# Patient Record
Sex: Male | Born: 1980 | Race: Black or African American | Hispanic: No | Marital: Married | Smoking: Never smoker
Health system: Southern US, Community
[De-identification: ages and names within clinical notes are randomized; demographics above are authoritative.]

## PROBLEM LIST (undated history)

## (undated) ENCOUNTER — Emergency Department (HOSPITAL_COMMUNITY): Admission: EM | Payer: BC Managed Care – PPO

## (undated) DIAGNOSIS — K219 Gastro-esophageal reflux disease without esophagitis: Secondary | ICD-10-CM

## (undated) HISTORY — DX: Gastro-esophageal reflux disease without esophagitis: K21.9

## (undated) HISTORY — PX: WISDOM TOOTH EXTRACTION: SHX21

---

## 2005-04-29 ENCOUNTER — Ambulatory Visit: Payer: Self-pay | Admitting: General Practice

## 2007-02-10 ENCOUNTER — Emergency Department (HOSPITAL_COMMUNITY): Admission: EM | Admit: 2007-02-10 | Discharge: 2007-02-10 | Payer: Self-pay | Admitting: Emergency Medicine

## 2011-02-05 LAB — GC/CHLAMYDIA PROBE AMP, GENITAL
Chlamydia, DNA Probe: NEGATIVE
GC Probe Amp, Genital: NEGATIVE

## 2011-02-05 LAB — POCT URINALYSIS DIP (DEVICE)
Bilirubin Urine: NEGATIVE
Glucose, UA: NEGATIVE
Hgb urine dipstick: NEGATIVE
Nitrite: NEGATIVE
Operator id: 247071
Urobilinogen, UA: 1

## 2011-03-25 ENCOUNTER — Ambulatory Visit: Payer: Self-pay | Admitting: Family Medicine

## 2011-03-25 ENCOUNTER — Ambulatory Visit (INDEPENDENT_AMBULATORY_CARE_PROVIDER_SITE_OTHER): Payer: BC Managed Care – PPO | Admitting: Family Medicine

## 2011-03-25 ENCOUNTER — Encounter: Payer: Self-pay | Admitting: Family Medicine

## 2011-03-25 DIAGNOSIS — Z23 Encounter for immunization: Secondary | ICD-10-CM

## 2011-03-25 DIAGNOSIS — Z823 Family history of stroke: Secondary | ICD-10-CM

## 2011-03-25 DIAGNOSIS — K219 Gastro-esophageal reflux disease without esophagitis: Secondary | ICD-10-CM

## 2011-03-25 DIAGNOSIS — Z833 Family history of diabetes mellitus: Secondary | ICD-10-CM

## 2011-03-25 DIAGNOSIS — M25559 Pain in unspecified hip: Secondary | ICD-10-CM

## 2011-03-25 NOTE — Patient Instructions (Addendum)
I would get a flu shot each fall.   I'll await your records.  I would recheck you labs next fall at a physical.  I would use the new holster and see if this doesn't gradually get better.  Take care.

## 2011-03-26 ENCOUNTER — Encounter: Payer: Self-pay | Admitting: Family Medicine

## 2011-03-26 DIAGNOSIS — Z23 Encounter for immunization: Secondary | ICD-10-CM | POA: Insufficient documentation

## 2011-03-26 DIAGNOSIS — M25559 Pain in unspecified hip: Secondary | ICD-10-CM | POA: Insufficient documentation

## 2011-03-26 DIAGNOSIS — K219 Gastro-esophageal reflux disease without esophagitis: Secondary | ICD-10-CM | POA: Insufficient documentation

## 2011-03-26 NOTE — Assessment & Plan Note (Signed)
Limit late night intake, elevated head of bed and try to limit PPI use.

## 2011-03-26 NOTE — Progress Notes (Signed)
Gerd- nocturnal sx, occ takes nexium.  Not on med currently.  Head of bed not elevated.  Eating late at night is a trigger.   R hip pain.  "nagging, doesn't feel right".  Occ feels stiff.  No known injury.  Some better after changing holsters, now sits higher on the belt.   Flu shot declined.   Meds, vitals, and allergies reviewed.   ROS: See HPI.  Otherwise, noncontributory.  GEN: nad, alert and oriented HEENT: mucous membranes moist NECK: supple w/o LA CV: rrr.  no murmur PULM: ctab, no inc wob ABD: soft, +bs EXT: no edema SKIN: no acute rash R hip with normal int/ext rotation, no pain on SI testing and no rash in the area.  No hernia.  Normal quad, abductor and adductor strength

## 2011-03-26 NOTE — Assessment & Plan Note (Signed)
Likely peripheral compression. No midline back pain.  Improved some with holster change. Should resolved.  No further w/u now.  Reassured.

## 2011-12-23 ENCOUNTER — Other Ambulatory Visit (INDEPENDENT_AMBULATORY_CARE_PROVIDER_SITE_OTHER): Payer: BC Managed Care – PPO

## 2011-12-23 DIAGNOSIS — Z823 Family history of stroke: Secondary | ICD-10-CM

## 2011-12-23 DIAGNOSIS — Z833 Family history of diabetes mellitus: Secondary | ICD-10-CM

## 2011-12-23 LAB — LIPID PANEL: Cholesterol: 160 mg/dL (ref 0–200)

## 2011-12-23 LAB — GLUCOSE, RANDOM: Glucose, Bld: 86 mg/dL (ref 70–99)

## 2011-12-30 ENCOUNTER — Ambulatory Visit (INDEPENDENT_AMBULATORY_CARE_PROVIDER_SITE_OTHER): Payer: BC Managed Care – PPO | Admitting: Family Medicine

## 2011-12-30 ENCOUNTER — Encounter: Payer: Self-pay | Admitting: Family Medicine

## 2011-12-30 VITALS — BP 118/74 | HR 72 | Temp 98.6°F | Ht 68.5 in | Wt 207.0 lb

## 2011-12-30 DIAGNOSIS — K219 Gastro-esophageal reflux disease without esophagitis: Secondary | ICD-10-CM

## 2011-12-30 DIAGNOSIS — M25579 Pain in unspecified ankle and joints of unspecified foot: Secondary | ICD-10-CM

## 2011-12-30 DIAGNOSIS — R109 Unspecified abdominal pain: Secondary | ICD-10-CM

## 2011-12-30 DIAGNOSIS — Z0000 Encounter for general adult medical examination without abnormal findings: Secondary | ICD-10-CM

## 2011-12-30 NOTE — Progress Notes (Signed)
CPE- See plan.  Routine anticipatory guidance given to patient.  See health maintenance. Tetanus 2011  Flu shot.  D/w pt.  Encouraged.   Colon and prostate cancer screening not indicated.  Living will.  D/w pt.  He's going to consider this.  He doesn't have one yet.   Exercising frequently.    GERD controlled with rare sx, rare PPI use.  Doesn't need a refill today.   R ankle popping.  Going on for years.  No pain.  No ROM deficit.    Occ lower abd pain followed by gas or BM. Pain relieved at that point.  No blood in stool.  No vomiting.  Irregular pattern.  Rare frequency, ~ once every other month.    PMH and SH reviewed  Meds, vitals, and allergies reviewed.   ROS: See HPI.  Otherwise negative.    GEN: nad, alert and oriented HEENT: mucous membranes moist NECK: supple w/o LA CV: rrr. PULM: ctab, no inc wob ABD: soft, +bs EXT: no edema SKIN: no acute rash R ankle with click on dorsiflexion occ but no laxity or edema/erythema.  No bruising.  Able to weight bear.  Not focally tender (but he does have f/u arch pain with exercise per his report).  [place] intact.

## 2011-12-30 NOTE — Patient Instructions (Addendum)
I would get a flu shot each fall.   Get a soft arch support insert and use a soft ankle sleeve.  That should help.  Take care.  Check up in about 2 years.

## 2011-12-31 DIAGNOSIS — M25579 Pain in unspecified ankle and joints of unspecified foot: Secondary | ICD-10-CM | POA: Insufficient documentation

## 2011-12-31 DIAGNOSIS — Z0000 Encounter for general adult medical examination without abnormal findings: Secondary | ICD-10-CM | POA: Insufficient documentation

## 2011-12-31 DIAGNOSIS — R109 Unspecified abdominal pain: Secondary | ICD-10-CM | POA: Insufficient documentation

## 2011-12-31 NOTE — Assessment & Plan Note (Signed)
Routine anticipatory guidance given to patient.  See health maintenance. Tetanus 2011  Flu shot.  D/w pt.  Encouraged.   Colon and prostate cancer screening not indicated.  Living will.  D/w pt.  He's going to consider this.  He doesn't have one yet.   Exercising frequently.

## 2011-12-31 NOTE — Assessment & Plan Note (Signed)
-   Continue prn PPI

## 2011-12-31 NOTE — Assessment & Plan Note (Signed)
Likely functional, ie gas related.  Rare sx with no red flags and normal exam.  Would follow up prn.  He agrees.

## 2011-12-31 NOTE — Assessment & Plan Note (Signed)
Would use soft arch supports and and ankle sleeve prn.  Benign exam o/w and he'll f/u prn.  I don't appreciate any laxity, weakness or tenderness over bony prominences.

## 2012-04-17 ENCOUNTER — Encounter (HOSPITAL_COMMUNITY): Payer: Self-pay | Admitting: Emergency Medicine

## 2012-04-17 ENCOUNTER — Emergency Department (HOSPITAL_COMMUNITY)
Admission: EM | Admit: 2012-04-17 | Discharge: 2012-04-17 | Disposition: A | Discharge status: Home / Self Care | Payer: BC Managed Care – PPO | Source: Home / Self Care

## 2012-04-17 DIAGNOSIS — J069 Acute upper respiratory infection, unspecified: Secondary | ICD-10-CM

## 2012-04-17 MED ORDER — GUAIFENESIN-CODEINE 100-10 MG/5ML PO SYRP: 5.0000 mL | ORAL_SOLUTION | Freq: Three times a day (TID) | ORAL | Status: DC | PRN

## 2012-04-17 NOTE — ED Notes (Signed)
Pt c/o poss flu... Pt's wife recently dx w/flu... Sx today include: dry cough, chest discomfort due to cough, headache, runny nose... Denies: fevers, vomiting, nauseas, diarrhea... He is alert w/no signs of acute distress.

## 2012-04-17 NOTE — ED Provider Notes (Signed)
Tanner Diaz is a 31 y.o. male who presents to Urgent Care today for cough headache sinus pressure and congestion present for one day.  His most bothersome symptom is a cough.  He notes positive sick contacts with respiratory viruses at home. He denies any fevers chills trouble breathing body aches.  He feels well otherwise. He left work early yesterday.  He tried some Robitussin which helped a bit.     PMH reviewed. Significant for GERD History  Substance Use Topics  . Smoking status: Never Smoker   . Smokeless tobacco: Never Used  . Alcohol Use: No   ROS as above Medications reviewed. No current facility-administered medications for this encounter.   Current Outpatient Prescriptions  Medication Sig Dispense Refill  . esomeprazole (NEXIUM) 40 MG capsule Take 40 mg by mouth daily as needed. Taken prn for GERD      . guaiFENesin-codeine (ROBITUSSIN AC) 100-10 MG/5ML syrup Take 5 mLs by mouth 3 (three) times daily as needed for cough.  120 mL  0  . Nutritional Supplements (COMPLETE PROTEIN/VITAMIN SHAKE PO) Take by mouth daily.        Exam:  BP 123/79  Pulse 106  Temp 99.7 F (37.6 C) (Oral)  Resp 18  SpO2 98% Gen: Well NAD HEENT: EOMI,  MMM, normal-appearing posterior pharynx and tympanic membranes. No significant cervical lymphadenopathy Lungs: CTABL Nl WOB Heart: RRR no MRG Abd: NABS, NT, ND Exts: Non edematous BL  LE, warm and well perfused.   No results found for this or any previous visit (from the past 24 hour(s)). No results found.  Assessment and Plan: 31 y.o. male with viral URI with cough. Discussed options. Plan to treat symptomatically with Tylenol and ibuprofen as needed. Additionally use codeine containing cough medication as needed for bothersome cough. Discussed warning signs or symptoms. Please see discharge instructions. Patient expresses understanding. Followup as needed     Rodolph Bong, MD 04/17/12 [phone]

## 2012-04-26 NOTE — ED Provider Notes (Signed)
Medical screening examination/treatment/procedure(s) were performed by resident physician or non-physician practitioner and as supervising physician I was immediately available for consultation/collaboration.   Barkley Bruns MD.    Linna Hoff, MD 04/26/12 2024

## 2012-09-14 ENCOUNTER — Encounter: Payer: Self-pay | Admitting: Family Medicine

## 2012-09-14 ENCOUNTER — Ambulatory Visit (INDEPENDENT_AMBULATORY_CARE_PROVIDER_SITE_OTHER): Payer: BC Managed Care – PPO | Admitting: Family Medicine

## 2012-09-14 ENCOUNTER — Encounter: Payer: Self-pay | Admitting: *Deleted

## 2012-09-14 VITALS — BP 104/76 | HR 73 | Temp 98.4°F | Wt 228.5 lb

## 2012-09-14 DIAGNOSIS — R1031 Right lower quadrant pain: Secondary | ICD-10-CM | POA: Insufficient documentation

## 2012-09-14 NOTE — Patient Instructions (Addendum)
If the pain increases or you notice more of a bulge, then let us know and we can set you up with the general surgeons.  Should you have a dramatic increase in pain (and I don't expect this to happen), then go to the ER.  Take care.  Glad to see you.
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
## 2012-09-14 NOTE — Assessment & Plan Note (Signed)
Benign hip exam.  He could have had a nagging bursitis from the fall earlier.  He appears to have a small inguinal hernia.  Anatomy discussed.  We talked about options.  Offered referral to surgery.  Another option is monitoring at home with routine cautions.  If not improved or if increasing (with strangulation/incarceration cautions given), then he'll notify us and we can refer. He opts for the latter.  This is reasonable.

## 2012-11-08 ENCOUNTER — Ambulatory Visit: Payer: BC Managed Care – PPO | Admitting: Family Medicine

## 2012-11-08 DIAGNOSIS — Z0289 Encounter for other administrative examinations: Secondary | ICD-10-CM

## 2012-11-10 ENCOUNTER — Encounter: Payer: Self-pay | Admitting: Family Medicine

## 2012-11-10 ENCOUNTER — Ambulatory Visit (INDEPENDENT_AMBULATORY_CARE_PROVIDER_SITE_OTHER): Payer: BC Managed Care – PPO | Admitting: Family Medicine

## 2012-11-10 VITALS — BP 118/76 | HR 80 | Temp 98.5°F | Wt 225.5 lb

## 2012-11-10 DIAGNOSIS — R1031 Right lower quadrant pain: Secondary | ICD-10-CM

## 2012-11-10 NOTE — Progress Notes (Signed)
F/u for the prev groin complaint.  The sx resolved about 1 week after the last OV.  He needed recheck for work.  No R groin pain now unless at extremes of abduction.  No functional impairment.   Meds, vitals, and allergies reviewed.   ROS: See HPI.  Otherwise, noncontributory.  nad Testes bilaterally descended without nodularity, tenderness or masses. No scrotal masses or lesions.  I don't feel any asymmetry with hernia exam today- no focal bulge or mass.

## 2012-11-10 NOTE — Patient Instructions (Addendum)
Go back to normal activity and let me know if you have troubles or a bulge.

## 2012-11-11 ENCOUNTER — Ambulatory Visit: Payer: BC Managed Care – PPO | Admitting: Family Medicine

## 2012-11-11 NOTE — Assessment & Plan Note (Signed)
Resolved, no mass noted, could have been a groin strain.  If he has other sx, he'll notify.  Return to baseline work status in meantime. F/u prn.  He agrees. D/w pt.

## 2013-03-22 ENCOUNTER — Telehealth: Payer: Self-pay

## 2013-03-22 DIAGNOSIS — R1030 Lower abdominal pain, unspecified: Secondary | ICD-10-CM

## 2013-03-22 NOTE — Telephone Encounter (Signed)
Pt left v/m; pt was seen 11/10/2012 and request referral. Left v.m for pt to cb to find out what problem pt is having and what type referral pt needs.

## 2013-03-22 NOTE — Telephone Encounter (Signed)
Referral placed  Thanks!

## 2013-03-22 NOTE — Telephone Encounter (Signed)
Pt said pain or discomfort has returned to rt groin area in the last week; no bulging noted. Pain level now is 2.pt request referral to doctor in [place]. Pt will cb if condition changes or worsens prior to cb.

## 2013-04-07 ENCOUNTER — Ambulatory Visit
Admission: RE | Admit: 2013-04-07 | Discharge: 2013-04-07 | Disposition: A | Discharge status: Home / Self Care | Payer: BC Managed Care – PPO | Source: Ambulatory Visit | Attending: General Surgery | Admitting: General Surgery

## 2013-04-07 ENCOUNTER — Encounter (INDEPENDENT_AMBULATORY_CARE_PROVIDER_SITE_OTHER): Payer: Self-pay | Admitting: General Surgery

## 2013-04-07 ENCOUNTER — Encounter (INDEPENDENT_AMBULATORY_CARE_PROVIDER_SITE_OTHER): Payer: Self-pay

## 2013-04-07 ENCOUNTER — Telehealth (INDEPENDENT_AMBULATORY_CARE_PROVIDER_SITE_OTHER): Payer: Self-pay | Admitting: General Surgery

## 2013-04-07 ENCOUNTER — Ambulatory Visit (INDEPENDENT_AMBULATORY_CARE_PROVIDER_SITE_OTHER): Payer: BC Managed Care – PPO | Admitting: General Surgery

## 2013-04-07 VITALS — BP 130/78 | HR 84 | Temp 97.0°F | Resp 18 | Ht 70.0 in | Wt 231.0 lb

## 2013-04-07 DIAGNOSIS — R1030 Lower abdominal pain, unspecified: Secondary | ICD-10-CM

## 2013-04-07 DIAGNOSIS — R1031 Right lower quadrant pain: Secondary | ICD-10-CM

## 2013-04-07 MED ORDER — LORAZEPAM 1 MG PO TABS: 1.0000 mg | ORAL_TABLET | Freq: Once | ORAL | Status: DC

## 2013-04-07 NOTE — Patient Instructions (Signed)
Get your xray done We will call you with results If negative, we will schedule an MRI

## 2013-04-07 NOTE — Telephone Encounter (Signed)
Informed pt that hip xray negative. He wants to proceed with MRI to evaluate for "sport's hernia". Told him our office would start working on it tomorrow. Will also send in Rx for ativan - on call for scan

## 2013-04-07 NOTE — Progress Notes (Signed)
Patient ID: Tanner Diaz, male   DOB: April 17, 1981, 33 y.o.   MRN: [id number]  Chief Complaint  Patient presents with  . Inguinal Hernia    right    HPI Tanner Diaz is a 32 y.o. male.   HPI 32 year old [place] American male referred by Dr. Para March for evaluation of right inguinal pain. The patient states about a year ago he developed right hip pain. This was mainly on the lateral aspect of his hip where he wore his sidearm. He changed how he carried his SideArm and his lateral right hip pain resolved. Back in the early spring he developed right groin pain. He states that it's truly not pain per se. He states that it just didn't feel right.He describes it as a pulling sensation.Tanner Diaz He states that he felt like he pulled his groin muscle. He states that by stretching his right leg and externally rotating his leg it'll help decrease that sensation. He denies any right large hernia weakness or numbness or tingling. He denies any sensation of burning or stinging in his groin. He denies noticing a bulge. He denies any nausea, vomiting, diarrhea or constipation. He states that he will notice a pulling sensation if he sits for prolonged period of time. He did a period of rest without any weight lifting or physical exertional activity and is improved after 8 weeks of rest.  However when he started resuming full extremities the sensation returned just like it was before. Past Medical History  Diagnosis Date  . GERD (gastroesophageal reflux disease)     History reviewed. No pertinent past surgical history.  Family History  Problem Relation Age of Onset  . Diabetes Other   . Stroke Father   . Diabetes Father   . Colon cancer Neg Hx   . Prostate cancer Neg Hx     Social History History  Substance Use Topics  . Smoking status: Never Smoker   . Smokeless tobacco: Never Used  . Alcohol Use: No    No Known Allergies  Current Outpatient Prescriptions  Medication Sig Dispense Refill  . LORazepam  (ATIVAN) 1 MG tablet Take 1 tablet (1 mg total) by mouth once. Take at MRI office prior to scan  1 tablet  0   No current facility-administered medications for this visit.    Review of Systems Review of Systems  Constitutional: Negative for fever, chills, appetite change and unexpected weight change.  HENT: Negative for congestion and trouble swallowing.   Eyes: Negative for visual disturbance.  Respiratory: Negative for chest tightness and shortness of breath.   Cardiovascular: Negative for chest pain and leg swelling.       No PND, no orthopnea, no DOE  Gastrointestinal:       See HPI  Genitourinary: Negative for dysuria, hematuria, penile swelling, scrotal swelling, difficulty urinating and testicular pain.  Musculoskeletal: Negative.   Skin: Negative for rash.  Neurological: Negative for seizures and speech difficulty.  Hematological: Does not bruise/bleed easily.  Psychiatric/Behavioral: Negative for behavioral problems and confusion.    Blood pressure 130/78, pulse 84, temperature 97 F (36.1 C), temperature source Temporal, resp. rate 18, height 5\' 10"  (1.778 m), weight 231 lb (104.781 kg).  Physical Exam Physical Exam  Constitutional: He is oriented to person, place, and time. He appears well-developed and well-nourished. No distress.  HENT:  Head: Normocephalic and atraumatic.  Right Ear: External ear normal.  Left Ear: External ear normal.  Eyes: Conjunctivae are normal. No scleral icterus.  Neck: Normal range of  motion. Neck supple. No tracheal deviation present. No thyromegaly present.  Cardiovascular: Normal rate, normal heart sounds and intact distal pulses.   Pulmonary/Chest: Effort normal and breath sounds normal. No respiratory distress. He has no wheezes.  Abdominal: Soft. He exhibits no distension. There is no tenderness. There is no guarding. Hernia confirmed negative in the right inguinal area and confirmed negative in the left inguinal area.  Genitourinary:  Testes normal and penis normal.  Patient examined supine and standing with and without Valsalva maneuvers. No obvious bulge. No obvious inguinal hernia on either side with exam. No appreciable increase in size of right inguinal ring compared to left  Musculoskeletal: Normal range of motion. He exhibits no edema and no tenderness.  Lymphadenopathy:    He has no cervical adenopathy.  Neurological: He is alert and oriented to person, place, and time. He exhibits normal muscle tone.  Skin: Skin is warm and dry. No rash noted. He is not diaphoretic. No erythema. No pallor.  Psychiatric: He has a normal mood and affect. His behavior is normal. Judgment and thought content normal.    Data Reviewed Dr Lianne Bushy office notes 5/20, 7/16  Assessment    Right inguinal pain     Plan    I really do not appreciate any inguinal hernia on exam. Since this is a different sensation than his right lateral hip discomfort my suspicion for labral tear is low. This is sounding more like a "sports hernia". We discussed this medical diagnosis. We discussed the typical first step of management is to rest and avoiding heavy lifting, pushing and pulling. He essentially has done this And when he resumed activity the discomfort returned. We discussed that this is a truly vexing condition and that there is no right way of how to work it up and/or manage it. I discussed that the best test to find any type of abdominal wall abnormality would be an MRI of his pelvis. We also discussed diagnostic laparoscopy but my first recommendation would be to start with more detailed imaging such as MRI of his pelvis to look for changes in his abdominal wall anatomy suggestive of a "sports hernia". He first wanted to start out with plain x-rays. I told her my suspicion for bony injury is quite quite low however we would not lose anything by getting a set of plain films. We agreed we would start with a set of plain films and if they were  negative to proceed with the MRI. He expressed some concern about the length of time that the MRI takes as well as being in a closed space. I told him I could give him a small dose of Ativan to take just prior to the MRI. Our followup will be based on the results of the imaging.    Mary Sella. Andrey Campanile, MD, FACS General, Bariatric, & Minimally Invasive Surgery [facility], [place]        [facility] M 04/07/2013, 6:07 PM

## 2013-04-08 NOTE — Addendum Note (Signed)
Addended byLiliana Cline on: 04/08/2013 08:51 AM   Modules accepted: Orders

## 2013-04-08 NOTE — Telephone Encounter (Signed)
MR order entered and given to referral coordinator to set up. Ativan called to patient's pharmacy.

## 2013-04-11 ENCOUNTER — Telehealth (INDEPENDENT_AMBULATORY_CARE_PROVIDER_SITE_OTHER): Payer: Self-pay | Admitting: *Deleted

## 2013-04-11 NOTE — Telephone Encounter (Signed)
LMOM for pt to return my call.  I was calling to notify him of an appt for his MRI at [facility] on 12/20 with an arrival time of 7:15am.

## 2013-04-12 NOTE — Telephone Encounter (Signed)
Pt returned my call and was given appt info below.

## 2013-04-16 ENCOUNTER — Inpatient Hospital Stay
Admission: RE | Admit: 2013-04-16 | Discharge: 2013-04-16 | Disposition: A | Discharge status: Home / Self Care | Payer: BC Managed Care – PPO | Source: Ambulatory Visit | Attending: General Surgery | Admitting: General Surgery

## 2013-04-25 ENCOUNTER — Telehealth (INDEPENDENT_AMBULATORY_CARE_PROVIDER_SITE_OTHER): Payer: Self-pay | Admitting: General Surgery

## 2013-04-25 NOTE — Telephone Encounter (Signed)
Left message for patient to call me regarding followup plan. His MRI of his pelvis was declined by insurance. If the patient is still having right inguinal pain I like to see him back in the office for followup.

## 2013-06-17 ENCOUNTER — Other Ambulatory Visit: Payer: Self-pay | Admitting: Family Medicine

## 2013-06-17 MED ORDER — OSELTAMIVIR PHOSPHATE 75 MG PO CAPS: 75.0000 mg | ORAL_CAPSULE | Freq: Two times a day (BID) | ORAL | Status: DC

## 2013-06-18 NOTE — Progress Notes (Signed)
Family member dx'd with flu.  Rx for appropriate dose of tamiflu given to patient to use if sx develop.  Routine instructions and cautions given.  

## 2013-12-05 ENCOUNTER — Encounter: Payer: Self-pay | Admitting: Family Medicine

## 2013-12-05 ENCOUNTER — Encounter (INDEPENDENT_AMBULATORY_CARE_PROVIDER_SITE_OTHER): Payer: Self-pay

## 2013-12-05 ENCOUNTER — Ambulatory Visit (INDEPENDENT_AMBULATORY_CARE_PROVIDER_SITE_OTHER): Payer: BC Managed Care – PPO | Admitting: Family Medicine

## 2013-12-05 VITALS — BP 110/76 | HR 73 | Temp 98.0°F | Wt 230.2 lb

## 2013-12-05 DIAGNOSIS — M25571 Pain in right ankle and joints of right foot: Secondary | ICD-10-CM

## 2013-12-05 DIAGNOSIS — M25579 Pain in unspecified ankle and joints of unspecified foot: Secondary | ICD-10-CM

## 2013-12-05 NOTE — Progress Notes (Signed)
Pre visit review using our clinic review tool, if applicable. No additional management support is needed unless otherwise documented below in the visit note.  R foot and ankle pain.  More in the last week.  Pain with walking.  Pain prior to getting out of bed.  It was initially getting better by the middle of the day.  It doesn't swell.  No bruising.  No trauma.  Usually in boots at work, not steel toes.  Feels no better in boots vs flip flops.  Can't jog on a treadmill due to pain.  No L foot pain.  Some popping with ROM at the R ankle.    Meds, vitals, and allergies reviewed.   ROS: See HPI.  Otherwise, noncontributory.  nad R leg and foot with normal inspection initially but loss of longitudinal and transverse arches with weight bearing No bruising, no swelling, normal DP pulse Achilles not ttp but ttp posterior to med and lat mal.  Plantar fascia ttp.  Distally nv intact Not ttp over bony prominences.

## 2013-12-05 NOTE — Patient Instructions (Signed)
Use the ankle support and avoid flip flops for now.  Get full length soft arch support inserts.  Stretch the bottom of your foot before you get out of bed.  Ice your foot as needed.  Ibuprofen as needed for pain with food in the meantime.  Notify me if not better.  Take care. Glad to see you.

## 2013-12-06 NOTE — Assessment & Plan Note (Addendum)
Likely tendonitis, posterior tib and posterior fib along with plantar fascia pain.  Felt better in ASO, more solid on standing.  Use ASO when weight bearing and stretch plantar fascia before standing.  Ice and nsaids reasonable.  GI caution.  Soft arch support inserts, may need additional arch support added on, would only change 1 think at a time in the sole of his shoes.   With ASO and relative dec in ROM, then the tendonitis should improve.  He agrees.  Achilles stable and not ttp.  No running for now. Gradually wean out of brace.  Call back as needed.

## 2014-05-25 ENCOUNTER — Encounter: Payer: Self-pay | Admitting: Internal Medicine

## 2014-05-25 ENCOUNTER — Ambulatory Visit (INDEPENDENT_AMBULATORY_CARE_PROVIDER_SITE_OTHER): Payer: BC Managed Care – PPO | Admitting: Internal Medicine

## 2014-05-25 VITALS — BP 118/84 | HR 88 | Temp 98.4°F | Wt 234.5 lb

## 2014-05-25 DIAGNOSIS — J069 Acute upper respiratory infection, unspecified: Secondary | ICD-10-CM

## 2014-05-25 DIAGNOSIS — R6883 Chills (without fever): Secondary | ICD-10-CM

## 2014-05-25 NOTE — Progress Notes (Signed)
Pre visit review using our clinic review tool, if applicable. No additional management support is needed unless otherwise documented below in the visit note. 

## 2014-05-25 NOTE — Patient Instructions (Signed)
Upper Respiratory Infection, Adult An upper respiratory infection (URI) is also sometimes known as the common cold. The upper respiratory tract includes the nose, sinuses, throat, trachea, and bronchi. Bronchi are the airways leading to the lungs. Most people improve within 1 week, but symptoms can last up to 2 weeks. A residual cough may last even longer.  CAUSES Many different viruses can infect the tissues lining the upper respiratory tract. The tissues become irritated and inflamed and often become very moist. Mucus production is also common. A cold is contagious. You can easily spread the virus to others by oral contact. This includes kissing, sharing a glass, coughing, or sneezing. Touching your mouth or nose and then touching a surface, which is then touched by another person, can also spread the virus. SYMPTOMS  Symptoms typically develop 1 to 3 days after you come in contact with a cold virus. Symptoms vary from person to person. They may include:  Runny nose.  Sneezing.  Nasal congestion.  Sinus irritation.  Sore throat.  Loss of voice (laryngitis).  Cough.  Fatigue.  Muscle aches.  Loss of appetite.  Headache.  Low-grade fever. DIAGNOSIS  You might diagnose your own cold based on familiar symptoms, since most people get a cold 2 to 3 times a year. Your caregiver can confirm this based on your exam. Most importantly, your caregiver can check that your symptoms are not due to another disease such as strep throat, sinusitis, pneumonia, asthma, or epiglottitis. Blood tests, throat tests, and X-rays are not necessary to diagnose a common cold, but they may sometimes be helpful in excluding other more serious diseases. Your caregiver will decide if any further tests are required. RISKS AND COMPLICATIONS  You may be at risk for a more severe case of the common cold if you smoke cigarettes, have chronic heart disease (such as heart failure) or lung disease (such as asthma), or if  you have a weakened immune system. The very young and very old are also at risk for more serious infections. Bacterial sinusitis, middle ear infections, and bacterial pneumonia can complicate the common cold. The common cold can worsen asthma and chronic obstructive pulmonary disease (COPD). Sometimes, these complications can require emergency medical care and may be life-threatening. PREVENTION  The best way to protect against getting a cold is to practice good hygiene. Avoid oral or hand contact with people with cold symptoms. Wash your hands often if contact occurs. There is no clear evidence that vitamin C, vitamin E, echinacea, or exercise reduces the chance of developing a cold. However, it is always recommended to get plenty of rest and practice good nutrition. TREATMENT  Treatment is directed at relieving symptoms. There is no cure. Antibiotics are not effective, because the infection is caused by a virus, not by bacteria. Treatment may include:  Increased fluid intake. Sports drinks offer valuable electrolytes, sugars, and fluids.  Breathing heated mist or steam (vaporizer or shower).  Eating chicken soup or other clear broths, and maintaining good nutrition.  Getting plenty of rest.  Using gargles or lozenges for comfort.  Controlling fevers with ibuprofen or acetaminophen as directed by your caregiver.  Increasing usage of your inhaler if you have asthma. Zinc gel and zinc lozenges, taken in the first 24 hours of the common cold, can shorten the duration and lessen the severity of symptoms. Pain medicines may help with fever, muscle aches, and throat pain. A variety of non-prescription medicines are available to treat congestion and runny nose. Your caregiver   can make recommendations and may suggest nasal or lung inhalers for other symptoms.  HOME CARE INSTRUCTIONS   Only take over-the-counter or prescription medicines for pain, discomfort, or fever as directed by your  caregiver.  Use a warm mist humidifier or inhale steam from a shower to increase air moisture. This may keep secretions moist and make it easier to breathe.  Drink enough water and fluids to keep your urine clear or pale yellow.  Rest as needed.  Return to work when your temperature has returned to normal or as your caregiver advises. You may need to stay home longer to avoid infecting others. You can also use a face mask and careful hand washing to prevent spread of the virus. SEEK MEDICAL CARE IF:   After the first few days, you feel you are getting worse rather than better.  You need your caregiver's advice about medicines to control symptoms.  You develop chills, worsening shortness of breath, or brown or red sputum. These may be signs of pneumonia.  You develop yellow or brown nasal discharge or pain in the face, especially when you bend forward. These may be signs of sinusitis.  You develop a fever, swollen neck glands, pain with swallowing, or white areas in the back of your throat. These may be signs of strep throat. SEEK IMMEDIATE MEDICAL CARE IF:   You have a fever.  You develop severe or persistent headache, ear pain, sinus pain, or chest pain.  You develop wheezing, a prolonged cough, cough up blood, or have a change in your usual mucus (if you have chronic lung disease).  You develop sore muscles or a stiff neck. Document Released: 10/08/2000 Document Revised: 07/07/2011 Document Reviewed: 07/20/2013 Exit[employer] Patient Information 2015 [employer]. This information is not intended to replace advice given to you by your health care provider. Make sure you discuss any questions you have with your health care provider.  

## 2014-05-25 NOTE — Progress Notes (Signed)
HPI  [name] is a 34 y.o. presenting to clinic due to chills, headache and nasal congestion x 2 days. He woke up yesterday morning and "felt like trash." Had sore throat, chills and overall body aches so stayed in bed all day. He wasn't able to sleep well last night because he can't breathe through his nose. 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 Feels better today without throat soreness but still can't breathe through nose. No cough, PND or fever. Daughter has a runny nose. Interested in being tested for the flu due to chills/body aches. He does not take a flu shot.  Review of Systems  Past Medical History  Diagnosis Date  . GERD (gastroesophageal reflux disease)     Family History  Problem Relation Age of Onset  . Diabetes Other   . Stroke Father   . Diabetes Father   . Colon cancer Neg Hx   . Prostate cancer Neg Hx     History   Social History  . Marital Status: Married    Spouse Name: N/A    Number of Children: N/A  . Years of Education: N/A   Occupational History  . Emergency planning/management officerolice Officer    Social History Main Topics  . Smoking status: Never Smoker   . Smokeless tobacco: Never Used  . Alcohol Use: No  . Drug Use: No  . Sexual Activity: Not on file   Other Topics Concern  . Not on file   Social History Narrative   [employer]   [employer] fan   Married 2004   2 kids   Likes to hunt    No Known Allergies   Constitutional: Positive headache, body aches, and fatigue. Denies fever or abrupt weight changes.  HEENT:  Positive nasal congestion. Denies sore throat, eye redness, eye pain, pressure behind the eyes, facial pain, ear pain, ringing in the ears, wax buildup, runny nose or bloody nose. Respiratory: Denies cough, difficulty breathing or shortness of breath.  Cardiovascular: Denies chest pain, chest tightness, palpitations or swelling in the hands or feet.   No other specific complaints in a complete review of systems  (except as listed in HPI above).  Objective:   BP 118/84 mmHg  Pulse 88  Temp(Src) 98.4 F (36.9 C) (Oral)  Wt 234 lb 8 oz (106.369 kg)  SpO2 98% Wt Readings from Last 3 Encounters:  05/25/14 234 lb 8 oz (106.369 kg)  12/05/13 230 lb 4 oz (104.441 kg)  04/07/13 231 lb (104.781 kg)   General: Appears his stated age, well developed,  HEENT: Head: normal shape and size; Eyes: sclera white, no icterus, conjunctiva pink; Ears: Tm's gray and intact, normal light reflex; Nose: mucosa pink and moist, septum midline; Throat/Mouth: + PND. Teeth present, mucosa erythematous and moist, no exudate noted, no lesions or ulcerations noted.  Neck: No adenopathy noted.  Cardiovascular: Normal rate and rhythm. S1,S2 noted.  No murmur, rubs or gallops noted.  Pulmonary/Chest: Normal effort and positive vesicular breath sounds. No respiratory distress. No wheezes, rales or ronchi noted.      Assessment & Plan:   Viral Upper Respiratory Infection:  Rapid Flu: negative Get some rest and drink plenty of water. Continue OTC Alka Seltzer. Flonase - 2 sprays in each nostril for congestion Can use Sudafed for more sinus relief.  RTC as needed or if symptoms persist.

## 2014-05-25 NOTE — Progress Notes (Signed)
Started yesterday morn

## 2014-06-15 LAB — POCT INFLUENZA A/B
Influenza A, POC: NEGATIVE
Influenza B, POC: NEGATIVE

## 2014-06-15 NOTE — Addendum Note (Signed)
Addended by: Roena MaladyEVONTENNO, Keshawna Dix Y on: 06/15/2014 12:16 PM   Modules accepted: Orders

## 2014-09-14 ENCOUNTER — Ambulatory Visit (INDEPENDENT_AMBULATORY_CARE_PROVIDER_SITE_OTHER): Payer: BC Managed Care – PPO | Admitting: Family Medicine

## 2014-09-14 ENCOUNTER — Telehealth: Payer: Self-pay

## 2014-09-14 ENCOUNTER — Encounter: Payer: Self-pay | Admitting: Family Medicine

## 2014-09-14 VITALS — BP 106/80 | HR 70 | Temp 98.5°F | Ht 70.0 in | Wt 228.2 lb

## 2014-09-14 DIAGNOSIS — Z0000 Encounter for general adult medical examination without abnormal findings: Secondary | ICD-10-CM

## 2014-09-14 DIAGNOSIS — B360 Pityriasis versicolor: Secondary | ICD-10-CM

## 2014-09-14 DIAGNOSIS — L089 Local infection of the skin and subcutaneous tissue, unspecified: Secondary | ICD-10-CM | POA: Diagnosis not present

## 2014-09-14 DIAGNOSIS — Z7189 Other specified counseling: Secondary | ICD-10-CM

## 2014-09-14 DIAGNOSIS — M549 Dorsalgia, unspecified: Secondary | ICD-10-CM

## 2014-09-14 DIAGNOSIS — L918 Other hypertrophic disorders of the skin: Secondary | ICD-10-CM | POA: Diagnosis not present

## 2014-09-14 DIAGNOSIS — Z823 Family history of stroke: Secondary | ICD-10-CM

## 2014-09-14 LAB — LIPID PANEL
CHOLESTEROL: 183 mg/dL (ref 0–200)
HDL: 55.8 mg/dL (ref >39.00)
LDL CALC: 116 mg/dL — AB (ref 0–99)
NONHDL: 127.2
Total CHOL/HDL Ratio: 3
Triglycerides: 58 mg/dL (ref 0.0–149.0)
VLDL: 11.6 mg/dL (ref 0.0–40.0)

## 2014-09-14 LAB — GLUCOSE, RANDOM: Glucose, Bld: 81 mg/dL (ref 70–99)

## 2014-09-14 MED ORDER — SELENIUM SULFIDE 2.5 % EX LOTN: 1.0000 "application " | TOPICAL_LOTION | Freq: Every day | CUTANEOUS | Status: DC | PRN

## 2014-09-14 NOTE — Progress Notes (Signed)
Pre visit review using our clinic review tool, if applicable. No additional management support is needed unless otherwise documented below in the visit note.  CPE- See plan.  Routine anticipatory guidance given to patient.  See health maintenance. Tetanus 2011 Flu d/w pt.   PNA and shingles not due Prostate cancer screening and PSA options (with potential risks and benefits of testing vs not testing) were discussed along with recent recs/guidelines.  PSA not indicated.  Colon cancer screening not due.  Living will d/w pt.  Wife designated if patient were incapacitated.  Diet and exercise d/w pt.  Working on gradual weight loss.  He's working on more cardio.    L midback pain occ noted.  No trauma.  occ noted with long driving.  No rash in that specific area.  No bruising.  Not constant pain.  No sciatica sx.  No midline pain.    Rash on upper back noted. occ itchy.  Will change color with sun exposure.  Skin tag on R side of neck.  Gets irritated.  Wants removal.   PMH and SH reviewed  Meds, vitals, and allergies reviewed.   ROS: See HPI.  Otherwise negative.    GEN: nad, alert and oriented HEENT: mucous membranes moist NECK: supple w/o LA CV: rrr. PULM: ctab, no inc wob ABD: soft, +bs EXT: no edema SKIN: no acute rash but tinea versicolor noted on back.  Skin tag on R side of neck.  Back w/o midline pain.  L side of back not ttp, normal back rom, distally s/s wnl BLE.

## 2014-09-14 NOTE — Patient Instructions (Addendum)
We'll contact you with your lab report. Use the cream on your back and that should help. Use it for 1 week.   Gently stretch your back.  Keep the skin tag area covered.   Take care.  Glad to see you.

## 2014-09-14 NOTE — Telephone Encounter (Signed)
Tanner Diaz with [facility] left v/m; received rx for selsun 2.5 % shampoo; Tobi Bastosnna said they have selsun 2.25 % shampoo and 2.5 % lotion. Tanner Diaz request cb.

## 2014-09-14 NOTE — Telephone Encounter (Signed)
Please give the order for lotion.  Thanks.

## 2014-09-14 NOTE — Telephone Encounter (Signed)
Anna advised.

## 2014-09-15 ENCOUNTER — Encounter: Payer: Self-pay | Admitting: *Deleted

## 2014-09-15 DIAGNOSIS — Z7189 Other specified counseling: Secondary | ICD-10-CM | POA: Insufficient documentation

## 2014-09-15 DIAGNOSIS — M549 Dorsalgia, unspecified: Secondary | ICD-10-CM | POA: Insufficient documentation

## 2014-09-15 DIAGNOSIS — B360 Pityriasis versicolor: Secondary | ICD-10-CM | POA: Insufficient documentation

## 2014-09-15 DIAGNOSIS — L918 Other hypertrophic disorders of the skin: Secondary | ICD-10-CM | POA: Insufficient documentation

## 2014-09-15 NOTE — Assessment & Plan Note (Signed)
Routine anticipatory guidance given to patient. See health maintenance.  Tetanus 2011  Flu d/w pt.  PNA and shingles not due  Prostate cancer screening and PSA options (with potential risks and benefits of testing vs not testing) were discussed along with recent recs/guidelines. PSA not indicated.  Colon cancer screening not due.  Living will d/w pt. Wife designated if patient were incapacitated.  Diet and exercise d/w pt. Working on gradual weight loss. He's working on more cardio.

## 2014-09-15 NOTE — Assessment & Plan Note (Signed)
Can use lumbar support in car.  D/w pt about stretching, esp lower back and hamstrings. He agrees.

## 2014-09-15 NOTE — Assessment & Plan Note (Signed)
Use selenium topically with routine instructions.  F/u prn.  He agrees.

## 2014-09-15 NOTE — Assessment & Plan Note (Signed)
Informed consent obtained.  1% lidocaine with epi etoh prep.  Snipped with scissors.  Very minimal bleeding, easily controlled with AgNO3 Covered with bandaid.  Routine cautions given to patient.  No complications.

## 2014-12-18 ENCOUNTER — Telehealth: Payer: Self-pay | Admitting: Family Medicine

## 2014-12-18 DIAGNOSIS — M25579 Pain in unspecified ankle and joints of unspecified foot: Secondary | ICD-10-CM

## 2014-12-18 NOTE — Telephone Encounter (Signed)
Pt called wanting to get a referral for right ankle pain.   He would like to go to [facility] pt appointment with dr copland 12/25/14.  Pt wanted to be seen sooner

## 2014-12-18 NOTE — Telephone Encounter (Signed)
Ordered.  I don't know if we'll be able to get him in quicker with ortho than with Copland.  Thanks.

## 2014-12-18 NOTE — Telephone Encounter (Signed)
Patient advised.

## 2015-06-11 ENCOUNTER — Encounter: Payer: Self-pay | Admitting: Family Medicine

## 2015-06-11 ENCOUNTER — Ambulatory Visit (INDEPENDENT_AMBULATORY_CARE_PROVIDER_SITE_OTHER): Payer: BC Managed Care – PPO | Admitting: Family Medicine

## 2015-06-11 VITALS — BP 122/76 | HR 83 | Temp 98.6°F | Wt 233.4 lb

## 2015-06-11 DIAGNOSIS — M545 Low back pain: Secondary | ICD-10-CM

## 2015-06-11 NOTE — Patient Instructions (Signed)
Likely a muscle strain, resolving.  Keep stretching.  Limit weight for now.  Stay hydrated.  Ease back into exercise.

## 2015-06-11 NOTE — Progress Notes (Signed)
Pre visit review using our clinic review tool, if applicable. No additional management support is needed unless otherwise documented below in the visit note.  Was at the gym last week.  Was doing chest and biceps and then felt something in his lower back.  Wasn't working his back at the time.  Then more B lower pain.  No FCNAVD.  Tried to stretch out as much as he could.  Tried heat and ibuprofen.  Better today.  It didn't feel likely a pulled muscle initially.  No leg pain.  No rash.  No dysuria.  No abd pain.  His back wasn't sore to the touch.    He had done back exercises 2 days before having back sx.  He has been doing relatively heavy weight with lower reps.    He feels fine today.    Meds, vitals, and allergies reviewed.   ROS: See HPI.  Otherwise, noncontributory.  GEN: nad, alert and oriented HEENT: mucous membranes moist NECK: supple w/o LA CV: rrr.  PULM: ctab, no inc wob ABD: soft, +bs EXT: no edema Back w/o midline pain, normal flex and ext, no ttp, no bruising.

## 2015-06-12 NOTE — Assessment & Plan Note (Signed)
D/w pt.  Likely a muscle strain, resolving.  Keep stretching. Limit weight for now.  Stay hydrated. Ease back into exercise.  F/u prn.  He agrees.

## 2015-11-23 ENCOUNTER — Ambulatory Visit: Payer: BC Managed Care – PPO | Admitting: Primary Care

## 2015-11-23 DIAGNOSIS — Z0289 Encounter for other administrative examinations: Secondary | ICD-10-CM

## 2016-01-04 ENCOUNTER — Other Ambulatory Visit: Payer: Self-pay | Admitting: Family Medicine

## 2016-01-04 ENCOUNTER — Other Ambulatory Visit (INDEPENDENT_AMBULATORY_CARE_PROVIDER_SITE_OTHER): Payer: BC Managed Care – PPO

## 2016-01-04 DIAGNOSIS — E785 Hyperlipidemia, unspecified: Secondary | ICD-10-CM

## 2016-01-04 DIAGNOSIS — Z131 Encounter for screening for diabetes mellitus: Secondary | ICD-10-CM

## 2016-01-04 LAB — LIPID PANEL
CHOL/HDL RATIO: 4
Cholesterol: 183 mg/dL (ref 0–200)
HDL: 49.9 mg/dL (ref >39.00)
LDL CALC: 108 mg/dL — AB (ref 0–99)
NONHDL: 132.67
TRIGLYCERIDES: 125 mg/dL (ref 0.0–149.0)
VLDL: 25 mg/dL (ref 0.0–40.0)

## 2016-01-04 LAB — GLUCOSE, RANDOM: GLUCOSE: 96 mg/dL (ref 70–99)

## 2016-01-10 ENCOUNTER — Encounter: Payer: Self-pay | Admitting: Family Medicine

## 2016-01-10 ENCOUNTER — Ambulatory Visit (INDEPENDENT_AMBULATORY_CARE_PROVIDER_SITE_OTHER): Payer: BC Managed Care – PPO | Admitting: Family Medicine

## 2016-01-10 VITALS — BP 112/78 | HR 86 | Temp 98.9°F | Ht 70.0 in | Wt 238.5 lb

## 2016-01-10 DIAGNOSIS — Z0000 Encounter for general adult medical examination without abnormal findings: Secondary | ICD-10-CM

## 2016-01-10 DIAGNOSIS — B360 Pityriasis versicolor: Secondary | ICD-10-CM

## 2016-01-10 MED ORDER — FLUCONAZOLE 150 MG PO TABS: 300.0000 mg | ORAL_TABLET | ORAL | 0 refills | Status: DC

## 2016-01-10 NOTE — Progress Notes (Signed)
Pre visit review using our clinic review tool, if applicable. No additional management support is needed unless otherwise documented below in the visit note. 

## 2016-01-10 NOTE — Progress Notes (Signed)
CPE- See plan.  Routine anticipatory guidance given to patient.  See health maintenance. Tetanus 2011  Flu d/w pt  PNA and shingles not due  Prostate cancer screening and PSA options (with potential risks and benefits of testing vs not testing) were discussed along with recent recs/guidelines.  PSA not indicated.  Colon cancer screening not due.  Living will d/w pt.  Wife designated if patient were incapacitated.  Diet and exercise d/w pt.  Working on gradual weight loss. His schedule is affected with birth of his daughter.  Weight is up some in the meantime.   HIV screening done prev about 2010.   Daughters are 3411 and 557 y/o and 2710 months old.    PMH and SH reviewed  Meds, vitals, and allergies reviewed.   ROS: Per HPI.  Unless specifically indicated otherwise in HPI, the patient denies:  General: fever. Eyes: acute vision changes ENT: sore throat Cardiovascular: chest pain Respiratory: SOB GI: vomiting GU: dysuria Musculoskeletal: acute back pain Derm: acute rash Neuro: acute motor dysfunction Psych: worsening mood Endocrine: polydipsia Heme: bleeding Allergy: hayfever  GEN: nad, alert and oriented HEENT: mucous membranes moist NECK: supple w/o LA CV: rrr. PULM: ctab, no inc wob ABD: soft, +bs EXT: no edema SKIN: no acute rash but tinea changes noted on the back.

## 2016-01-10 NOTE — Patient Instructions (Signed)
I would get a flu shot each fall.   Take care.  Glad to see you.  Try diflucan weekly for tinea.  Update me as needed.

## 2016-01-11 NOTE — Assessment & Plan Note (Signed)
Tetanus 2011  Flu d/w pt, encouraged.  PNA and shingles not due  Prostate cancer screening and PSA options (with potential risks and benefits of testing vs not testing) were discussed along with recent recs/guidelines.  PSA not indicated.  Colon cancer screening not due.  Living will d/w pt.  Wife designated if patient were incapacitated.  Diet and exercise d/w pt.  Working on gradual weight loss. His schedule is affected with birth of his daughter.  Weight is up some in the meantime.   HIV screening done prev about 2010.

## 2016-01-11 NOTE — Assessment & Plan Note (Signed)
D/w pt about diflucan.  Has returned after topical tx.  See AVS.

## 2016-03-30 ENCOUNTER — Emergency Department (HOSPITAL_COMMUNITY): Payer: BC Managed Care – PPO

## 2016-03-30 ENCOUNTER — Telehealth: Payer: Self-pay | Admitting: Family Medicine

## 2016-03-30 ENCOUNTER — Encounter (HOSPITAL_COMMUNITY): Payer: Self-pay

## 2016-03-30 ENCOUNTER — Emergency Department (HOSPITAL_COMMUNITY)
Admission: EM | Admit: 2016-03-30 | Discharge: 2016-03-30 | Disposition: A | Discharge status: Home / Self Care | Payer: BC Managed Care – PPO | Attending: Physician Assistant | Admitting: Physician Assistant

## 2016-03-30 DIAGNOSIS — Y999 Unspecified external cause status: Secondary | ICD-10-CM | POA: Diagnosis not present

## 2016-03-30 DIAGNOSIS — Y939 Activity, unspecified: Secondary | ICD-10-CM | POA: Insufficient documentation

## 2016-03-30 DIAGNOSIS — Y92410 Unspecified street and highway as the place of occurrence of the external cause: Secondary | ICD-10-CM | POA: Diagnosis not present

## 2016-03-30 DIAGNOSIS — S0990XA Unspecified injury of head, initial encounter: Secondary | ICD-10-CM | POA: Diagnosis not present

## 2016-03-30 MED ORDER — IBUPROFEN 400 MG PO TABS
800.0000 mg | ORAL_TABLET | Freq: Once | ORAL | Status: AC
  Administered 2016-03-30: 800 mg via ORAL
  Filled 2016-03-30: qty 2

## 2016-03-30 MED ORDER — CYCLOBENZAPRINE HCL 10 MG PO TABS: 10.0000 mg | ORAL_TABLET | Freq: Two times a day (BID) | ORAL | 0 refills | Status: DC | PRN

## 2016-03-30 MED ORDER — IBUPROFEN 800 MG PO TABS: 800.0000 mg | ORAL_TABLET | Freq: Three times a day (TID) | ORAL | 0 refills | Status: DC
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
## 2016-03-30 NOTE — Telephone Encounter (Signed)
Please call and check on patient. Recent ER evaluation. Thanks.
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
## 2016-03-31 NOTE — Telephone Encounter (Signed)
Noted. Thanks.

## 2016-04-10 ENCOUNTER — Ambulatory Visit (INDEPENDENT_AMBULATORY_CARE_PROVIDER_SITE_OTHER): Payer: BC Managed Care – PPO | Admitting: Family Medicine

## 2016-04-10 ENCOUNTER — Encounter: Payer: Self-pay | Admitting: Family Medicine

## 2016-04-10 VITALS — BP 122/82 | HR 87 | Temp 98.5°F | Wt 235.8 lb

## 2016-04-10 DIAGNOSIS — M549 Dorsalgia, unspecified: Secondary | ICD-10-CM | POA: Diagnosis not present

## 2016-04-10 DIAGNOSIS — S060X0D Concussion without loss of consciousness, subsequent encounter: Secondary | ICD-10-CM | POA: Diagnosis not present

## 2016-04-10 NOTE — Patient Instructions (Signed)
Concussion.  Physical and brain rest in the meantime.  Update me if you have persistent troubles with sleep, headaches, etc.  Take care.  Glad to see you.

## 2016-04-10 NOTE — Progress Notes (Signed)
Pre visit review using our clinic review tool, if applicable. No additional management support is needed unless otherwise documented below in the visit note. 
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
## 2016-04-11 DIAGNOSIS — S060X0A Concussion without loss of consciousness, initial encounter: Secondary | ICD-10-CM | POA: Insufficient documentation
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
## 2016-06-05 ENCOUNTER — Telehealth: Payer: Self-pay | Admitting: Family Medicine

## 2016-06-05 DIAGNOSIS — M25571 Pain in right ankle and joints of right foot: Secondary | ICD-10-CM

## 2016-06-05 NOTE — Telephone Encounter (Signed)
Ordered. Thanks

## 2016-06-05 NOTE — Telephone Encounter (Signed)
Pt would like to have referral to ortho for his right ankle.  [facility]  He would like someone affiliated with [facility] or [facility]  cb number is [phone]

## 2016-12-24 ENCOUNTER — Ambulatory Visit (INDEPENDENT_AMBULATORY_CARE_PROVIDER_SITE_OTHER): Payer: BC Managed Care – PPO | Admitting: Family Medicine

## 2016-12-24 ENCOUNTER — Encounter: Payer: Self-pay | Admitting: Family Medicine

## 2016-12-24 VITALS — BP 120/78 | HR 78 | Temp 98.8°F | Wt 224.0 lb

## 2016-12-24 DIAGNOSIS — B360 Pityriasis versicolor: Secondary | ICD-10-CM

## 2016-12-24 DIAGNOSIS — R195 Other fecal abnormalities: Secondary | ICD-10-CM

## 2016-12-24 DIAGNOSIS — R21 Rash and other nonspecific skin eruption: Secondary | ICD-10-CM

## 2016-12-24 DIAGNOSIS — M545 Low back pain, unspecified: Secondary | ICD-10-CM

## 2016-12-24 NOTE — Patient Instructions (Signed)
I think the loose stools are likely going to get better on your own.  Reasonable to try a probiotic in the meantime.   If getting worse then let me know.  Avoid dairy for now.    Tanner Diaz will call about your referral to derm.  If your back bothers you more then we can set you up with PT.  Take care.  Glad to see you.
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
## 2016-12-25 DIAGNOSIS — R195 Other fecal abnormalities: Secondary | ICD-10-CM | POA: Insufficient documentation

## 2016-12-25 NOTE — Assessment & Plan Note (Signed)
We can refer to physical therapy if and when the patient desires. He will update me as needed.

## 2016-12-25 NOTE — Assessment & Plan Note (Signed)
This still looks like tinea versicolor and he has a typical history but he has not responded to treatment. Refer to dermatology.

## 2016-12-25 NOTE — Assessment & Plan Note (Signed)
No fevers, chills, vomiting. He is not ill-appearing. He does not feel unwell otherwise. No palpable pain. Differential diagnosis discussed with patient. He likely has benign self-limited process that should resolve. Question I have is if he likely had a benign viral gastroenteritis. At this point with benign abdominal exam I would not put him through an extensive lab workup or imaging. Reasonable to try probiotic for a few days and update me if not getting better. This likely the safest option at this point. He agrees. He will update me as needed.

## 2017-02-26 ENCOUNTER — Other Ambulatory Visit: Payer: Self-pay | Admitting: Family Medicine

## 2017-02-26 DIAGNOSIS — Z131 Encounter for screening for diabetes mellitus: Secondary | ICD-10-CM

## 2017-02-26 DIAGNOSIS — E785 Hyperlipidemia, unspecified: Secondary | ICD-10-CM

## 2017-02-27 ENCOUNTER — Other Ambulatory Visit (INDEPENDENT_AMBULATORY_CARE_PROVIDER_SITE_OTHER): Payer: BC Managed Care – PPO

## 2017-02-27 DIAGNOSIS — E785 Hyperlipidemia, unspecified: Secondary | ICD-10-CM

## 2017-02-27 DIAGNOSIS — Z131 Encounter for screening for diabetes mellitus: Secondary | ICD-10-CM | POA: Diagnosis not present

## 2017-02-27 LAB — LIPID PANEL
CHOL/HDL RATIO: 3
Cholesterol: 145 mg/dL (ref 0–200)
HDL: 50.3 mg/dL (ref >39.00)
LDL CALC: 84 mg/dL (ref 0–99)
NONHDL: 95.15
Triglycerides: 56 mg/dL (ref 0.0–149.0)
VLDL: 11.2 mg/dL (ref 0.0–40.0)

## 2017-02-27 LAB — GLUCOSE, RANDOM: Glucose, Bld: 94 mg/dL (ref 70–99)

## 2017-03-03 ENCOUNTER — Encounter: Payer: Self-pay | Admitting: Family Medicine

## 2017-03-03 ENCOUNTER — Ambulatory Visit (INDEPENDENT_AMBULATORY_CARE_PROVIDER_SITE_OTHER): Payer: BC Managed Care – PPO | Admitting: Family Medicine

## 2017-03-03 VITALS — BP 110/70 | HR 81 | Temp 98.6°F | Wt 220.0 lb

## 2017-03-03 DIAGNOSIS — Z0000 Encounter for general adult medical examination without abnormal findings: Secondary | ICD-10-CM | POA: Diagnosis not present

## 2017-03-03 DIAGNOSIS — R195 Other fecal abnormalities: Secondary | ICD-10-CM

## 2017-03-03 NOTE — Patient Instructions (Signed)
I'll await the dermatology notes.  Take care.  Glad to see you.  Keep exercising.  Update me as needed.  Think about getting a flu shot.

## 2017-03-03 NOTE — Progress Notes (Signed)
CPE- See plan.  Routine anticipatory guidance given to patient.  See health maintenance.  The possibility exists that previously documented standard health maintenance information may have been brought forward from a previous encounter into this note.  If needed, that same information has been updated to reflect the current situation based on today's encounter.    Tetanus 2011 Flu d/w pt.   PNA and shingles not due  Prostate cancer screening and PSA options(with potential risks and benefits of testing vs not testing) were discussed along with recent recs/guidelines. PSA not indicated.  Colon cancer screening not due.  Living will d/w pt. Wife designated if patient were incapacitated.  Diet and exercise d/w pt.  HIV screening done prev about 2010.   He has f/u pending about his rash.  It is a little better but not resolved.  Discoloration more than any other findings.  No itch.  No pain.  No rough skin.  Stool changes have resolved.  He has been working on a healthy diet and that may have helped.  He cut back some on dairy, which may have helped.    His back was a little sore after working in the yard but o/w w/o problems.  No residual concussion sx at this point.    PMH and SH reviewed  Meds, vitals, and allergies reviewed.   ROS: Per HPI.  Unless specifically indicated otherwise in HPI, the patient denies:  General: fever. Eyes: acute vision changes ENT: sore throat Cardiovascular: chest pain Respiratory: SOB GI: vomiting GU: dysuria Musculoskeletal: acute back pain Derm: acute rash Neuro: acute motor dysfunction Psych: worsening mood Endocrine: polydipsia Heme: bleeding Allergy: hayfever  GEN: nad, alert and oriented HEENT: mucous membranes moist NECK: supple w/o LA CV: rrr. 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

## 2017-03-04 NOTE — Assessment & Plan Note (Signed)
May have been related to dairy intake.  Resolved now.

## 2017-03-04 NOTE — Assessment & Plan Note (Signed)
Tetanus 2011 Flu d/w pt.   PNA and shingles not due  Prostate cancer screening and PSA options(with potential risks and benefits of testing vs not testing) were discussed along with recent recs/guidelines. PSA not indicated.  Colon cancer screening not due.  Living will d/w pt. Wife designated if patient were incapacitated.  Diet and exercise d/w pt.  HIV screening done prev about 2010.  Labs d/w pt.

## 2017-09-14 ENCOUNTER — Encounter: Payer: Self-pay | Admitting: Family Medicine

## 2017-09-14 ENCOUNTER — Ambulatory Visit: Payer: BC Managed Care – PPO | Admitting: Family Medicine

## 2017-09-14 ENCOUNTER — Encounter (INDEPENDENT_AMBULATORY_CARE_PROVIDER_SITE_OTHER): Payer: Self-pay

## 2017-09-14 DIAGNOSIS — M79673 Pain in unspecified foot: Secondary | ICD-10-CM

## 2017-09-14 MED ORDER — COLCHICINE 0.6 MG PO TABS: 0.6000 mg | ORAL_TABLET | Freq: Two times a day (BID) | ORAL | 1 refills | Status: DC | PRN

## 2017-09-14 NOTE — Patient Instructions (Signed)
Take colchicine up to twice a day and update me if not better.  Likely gout.  Look at the handout.  Take care.  Glad to see you.

## 2017-09-14 NOTE — Progress Notes (Signed)
R distal 4th and 5th MT pain.  Pain with walking.  No trauma.  He had worn some old shoes prior to having pain.  He had pain throughout the weekend.  Reddish locally.  ttp locally.  Pain was bad enough to be out of work today.  He has been running some, once last week, only short distances on treadmill.  No known h/o gout.    Meds, vitals, and allergies reviewed.   ROS: Per HPI unless specifically indicated in ROS section   GEN: nad, alert and oriented HEENT: mucous membranes moist CV: rrr.  PULM: ctab, no inc wob ABD: soft, +bs EXT: no edema SKIN: no acute rash but R 5th MTP ttp and pinkish locally with discomfort on range of motion.  He is still able to move the toe..  Otherwise the fifth metatarsal is not tender.  Midfoot is not tender.  Distally the right fifth toe is not tender.  Able to bear weight.  Normal dorsalis pedis pulse.  Ankle is stable.  Calcaneus, medial and lateral malleoli are not tender.

## 2017-09-15 DIAGNOSIS — M79673 Pain in unspecified foot: Secondary | ICD-10-CM | POA: Insufficient documentation

## 2017-09-15 NOTE — Assessment & Plan Note (Signed)
There was no specific trauma or bite that he can recall.  I highly doubt an infectious source.  I think if he had a septic arthritis or cellulitis on the foot, he would feel much worse and this would likely be spreading.  Since that is not the case, and without trauma, and without known insect bite, it seems more likely that this could be gout.  Discussed with patient.  He does not have a known history of gout.  Gout diet discussed with patient.  Will try colchicine.  Routine cautions given.  If not any better then he will update me.  If he clearly improves with colchicine then this likely does represent gout.  If he had recurrent flares we could check uric acid, etc.  If his pain is worsening he can image.  Rationale discussed with patient.  At this point extra labs or imaging would likely not change the plan.  He understood. Okay for outpatient f/u.

## 2017-09-16 ENCOUNTER — Encounter: Payer: Self-pay | Admitting: *Deleted

## 2017-09-16 ENCOUNTER — Telehealth: Payer: Self-pay | Admitting: *Deleted

## 2017-09-16 NOTE — Telephone Encounter (Signed)
Spoke to patient by telephone and was advised that he needs the note to show that he can go back to work on 09/21/2017. Advised patient that the note will be up front ready for pickup later today. Patient stated that his foot is doing better and the medication seems to be working. Patient stated that the redness is gone, but he still  has pain when he tries to put on his work shoes. Patient stated that the pain is not as bad when he is wearing his flip flops or crocs.  Letter is on your desk for your signature.

## 2017-09-16 NOTE — Telephone Encounter (Signed)
Patient notified as instructed by telephone and verbalized understanding. 

## 2017-09-16 NOTE — Telephone Encounter (Signed)
Copied from CRM [id number]. Topic: General - Other >> Sep 16, 2017 10:46 AM Marylen Ponto wrote: Reason for CRM: Pt request that the letter for return to work be extended until after 09/18/17. Cb# [phone]

## 2017-09-16 NOTE — Telephone Encounter (Signed)
Please give him a letter for the dates requested re: foot pain.  Please get update on patient.  How much pain/redness?  Any better?  Response to colchicine?  Thanks.

## 2017-09-16 NOTE — Telephone Encounter (Signed)
Letter done, if not continuing to get better then let me know.  Thanks.
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
## 2017-10-26 ENCOUNTER — Telehealth: Payer: Self-pay | Admitting: Family Medicine

## 2017-10-26 MED ORDER — AMOXICILLIN-POT CLAVULANATE 875-125 MG PO TABS: 1.0000 | ORAL_TABLET | Freq: Two times a day (BID) | ORAL | 0 refills | Status: DC

## 2017-10-26 NOTE — Telephone Encounter (Signed)
Copied from CRM [id number]. Topic: Quick Communication - See Telephone Encounter >> Oct 26, 2017  9:30 AM Floria RavelingStovall, Shana A wrote: CRM for notification. See Telephone encounter for: 10/26/17.  Pt called in and stated that he is out of town and has not been feeling well.  He has tried different meds OTC and not he is coughing up brown mucus and blowing out green mucus.  He would like to know if DR Para Marchuncan would be willing to call something in for him   Best number  [phone]   [phone]- [phone]

## 2017-10-26 NOTE — Telephone Encounter (Signed)
Patient calling Tanner Diaz back. Advised will hear back tomorrow.

## 2017-10-26 NOTE — Telephone Encounter (Signed)
Left message on voicemail for patient to call back. 

## 2017-10-26 NOTE — Telephone Encounter (Signed)
[place] not be returning home until 10/31/17. Pt is not running fever; pt has head congestion with green mucus and prod cough with thick brown phlegm. Pt also has h/a over both temples and feels like head is in a vice.no SOB or wheezing. Pt prefers not to go to UC and request med sent to [facility] in [place]. Pt request cb.

## 2017-10-26 NOTE — Telephone Encounter (Signed)
Please notify pt.   I sent the rx.  If he has had sx for less than 1 week, then use nasal saline and flonase.  If sx 1 week or longer, then continue with the above and start augmentin with the caveat to get checked if worse in the meantime.  Thanks.  Hope he feels better soon.

## 2017-10-27 NOTE — Telephone Encounter (Signed)
Left detailed message on voicemail.  

## 2017-10-27 NOTE — Telephone Encounter (Signed)
Left message on voicemail for patient to call back. 

## 2017-10-28 NOTE — Telephone Encounter (Signed)
Pt aware of Dr Para Marchuncan instructions and verbalized understanding. Pt states thank you.

## 2017-12-15 ENCOUNTER — Ambulatory Visit: Payer: BC Managed Care – PPO | Admitting: Internal Medicine

## 2017-12-15 DIAGNOSIS — Z0289 Encounter for other administrative examinations: Secondary | ICD-10-CM

## 2017-12-21 ENCOUNTER — Ambulatory Visit (INDEPENDENT_AMBULATORY_CARE_PROVIDER_SITE_OTHER): Payer: BC Managed Care – PPO | Admitting: Family Medicine

## 2017-12-21 ENCOUNTER — Encounter: Payer: Self-pay | Admitting: Family Medicine

## 2017-12-21 DIAGNOSIS — R109 Unspecified abdominal pain: Secondary | ICD-10-CM | POA: Diagnosis not present

## 2017-12-21 NOTE — Progress Notes (Signed)
For the last 2 weeks, "something going on beneath my belly button."  Brief sharp pain.  Pain with a BM, but could happen w/o a BM.  Noted intermittently.  No sx in the last few days.  Wasn't painful to the touch.    He had a few days w/o a BM prev, but feels better now, BMs back to normal now.  No upper abd pain.  His baseline BM frequency is 2-3x/day.    No pain with urination.  No FCNAVD.  No blood in stool.    He didn't have atypical foods other than drinking some lemonade on separate days.    Pain was lower midline, just behind his belt buckle.  He never felt really sick.    Meds, vitals, and allergies reviewed.   ROS: Per HPI unless specifically indicated in ROS section   GEN: nad, alert and oriented HEENT: mucous membranes moist NECK: supple w/o LA CV: rrr.  PULM: ctab, no inc wob ABD: soft, +bs, not ttp at all, no rebound.  EXT: no edema SKIN: no acute rash

## 2017-12-21 NOTE — Patient Instructions (Signed)
Likely intermittent colonic spasm, possibly related to constipation.  Increase fiber, update me as needed.  Take care.  Glad to see you.

## 2017-12-22 NOTE — Assessment & Plan Note (Signed)
The patient was having pain near the suprapubic area.  He has 0 urinary symptoms.  I question if he had likely intermittent sigmoid colonic spasm, possibly related to constipation.  Increase fiber, update me as needed.  Completely benign abdominal exam at this point.  Blood in stool.  Okay for outpatient follow-up.  He agrees with plan.

## 2018-02-28 ENCOUNTER — Encounter: Payer: Self-pay | Admitting: Emergency Medicine

## 2018-02-28 ENCOUNTER — Emergency Department: Payer: BC Managed Care – PPO

## 2018-02-28 ENCOUNTER — Emergency Department
Admission: EM | Admit: 2018-02-28 | Discharge: 2018-02-28 | Disposition: A | Discharge status: Home / Self Care | Payer: BC Managed Care – PPO | Attending: Emergency Medicine | Admitting: Emergency Medicine

## 2018-02-28 ENCOUNTER — Other Ambulatory Visit: Payer: Self-pay

## 2018-02-28 DIAGNOSIS — Y939 Activity, unspecified: Secondary | ICD-10-CM | POA: Diagnosis not present

## 2018-02-28 DIAGNOSIS — Z79899 Other long term (current) drug therapy: Secondary | ICD-10-CM | POA: Diagnosis not present

## 2018-02-28 DIAGNOSIS — S6991XA Unspecified injury of right wrist, hand and finger(s), initial encounter: Secondary | ICD-10-CM | POA: Diagnosis present

## 2018-02-28 DIAGNOSIS — Y999 Unspecified external cause status: Secondary | ICD-10-CM | POA: Insufficient documentation

## 2018-02-28 DIAGNOSIS — Y929 Unspecified place or not applicable: Secondary | ICD-10-CM | POA: Insufficient documentation

## 2018-02-28 DIAGNOSIS — W230XXA Caught, crushed, jammed, or pinched between moving objects, initial encounter: Secondary | ICD-10-CM | POA: Insufficient documentation

## 2018-02-28 DIAGNOSIS — S61210A Laceration without foreign body of right index finger without damage to nail, initial encounter: Secondary | ICD-10-CM | POA: Diagnosis not present

## 2018-02-28 MED ORDER — CEPHALEXIN 500 MG PO CAPS: 500.0000 mg | ORAL_CAPSULE | Freq: Three times a day (TID) | ORAL | 0 refills | Status: AC

## 2018-02-28 MED ORDER — HYDROCODONE-ACETAMINOPHEN 5-325 MG PO TABS
1.0000 | ORAL_TABLET | Freq: Once | ORAL | Status: DC
  Filled 2018-02-28: qty 1

## 2018-02-28 MED ORDER — TRAMADOL HCL 50 MG PO TABS: 50.0000 mg | ORAL_TABLET | Freq: Four times a day (QID) | ORAL | 0 refills | Status: DC | PRN

## 2018-02-28 MED ORDER — NAPROXEN 500 MG PO TABS
500.0000 mg | ORAL_TABLET | Freq: Once | ORAL | Status: AC
  Administered 2018-02-28: 500 mg via ORAL
  Filled 2018-02-28: qty 1

## 2018-02-28 NOTE — ED Notes (Signed)
Pt to the er for a lac to the 2nd digit on the right hand. Pt was cleaning a gun and it slipped. Pt has a lac on the posterior 2nd digit congruent to the nail and then following around the nail line. Only the skin involved.

## 2018-02-28 NOTE — ED Provider Notes (Addendum)
[facility] Emergency Department Provider Note  ____________________________________________  Time seen: Approximately 8:49 PM  I have reviewed the triage vital signs and the nursing notes.   HISTORY  Chief Complaint Finger Injury   HPI Tanner Diaz is a 37 y.o. male who presents to the emergency department for treatment and evaluation of a right index finger injury.  While cleaning his muzzle loader, he smashed it.  The nail and and side of finger was pinched.  Bleeding was hard to control at home but is well controlled here with a bandage.  Tetanus is up-to-date.patient is not diabetic.   Past Medical History:  Diagnosis Date  . GERD (gastroesophageal reflux disease)     Patient Active Problem List   Diagnosis Date Noted  . Foot pain 09/15/2017  . Advance care planning 09/15/2014  . Tinea versicolor 09/15/2014  . Back pain 09/15/2014  . Routine general medical examination at a health care facility 12/31/2011  . Abdominal pain 12/31/2011  . GERD (gastroesophageal reflux disease) 03/26/2011    History reviewed. No pertinent surgical history.  Prior to Admission medications   Medication Sig Start Date End Date Taking? Authorizing Provider  cephALEXin (KEFLEX) 500 MG capsule Take 1 capsule (500 mg total) by mouth 3 (three) times daily for 10 days. 02/28/18 03/10/18  Sinaya Minogue, Rulon Eisenmenger B, FNP  colchicine 0.6 MG tablet Take 1 tablet (0.6 mg total) by mouth 2 (two) times daily as needed. 09/14/17   Joaquim Nam, MD  ibuprofen (ADVIL,MOTRIN) 800 MG tablet Take 1 tablet (800 mg total) by mouth 3 (three) times daily. 03/30/16   Law, Waylan Boga, PA-C  PROTEIN PO Take by mouth daily.    [provider]  traMADol (ULTRAM) 50 MG tablet Take 1 tablet (50 mg total) by mouth every 6 (six) hours as needed. 02/28/18   Cassidey Barrales, Rulon Eisenmenger B, FNP    Allergies Milk-related compounds  Family History  Problem Relation Age of Onset  . Stroke Father   . Diabetes  Father   . Diabetes Other   . Colon cancer Neg Hx   . Prostate cancer Neg Hx     Social History Social History   Tobacco Use  . Smoking status: Never Smoker  . Smokeless tobacco: Never Used  Substance Use Topics  . Alcohol use: No    Alcohol/week: 0.0 standard drinks  . Drug use: No    Review of Systems  Constitutional: Negative for fever. Respiratory: Negative for cough or shortness of breath.  Musculoskeletal: Negative for myalgias Skin: Positive for laceration to the right index finger. Neurological: Positive for paresthesia on the lateral aspect of the index finger. ____________________________________________   PHYSICAL EXAM:  VITAL SIGNS: ED Triage Vitals  Enc Vitals Group     BP 02/28/18 1840 123/84     Pulse Rate 02/28/18 1840 75     Resp 02/28/18 1840 18     Temp 02/28/18 1840 98.6 F (37 C)     Temp Source 02/28/18 1840 Oral     SpO2 02/28/18 1840 99 %     Weight 02/28/18 1841 225 lb (102.1 kg)     Height 02/28/18 1841 5\' 10"  (1.778 m)     Head Circumference --      Peak Flow --      Pain Score 02/28/18 1841 1     Pain Loc --      Pain Edu? --      Excl. in GC? --  Constitutional: Well appearing. Eyes: Conjunctivae are clear without discharge or drainage. Nose: No rhinorrhea noted. Mouth/Throat: Airway is patent.  Neck: No stridor. Unrestricted range of motion observed. Cardiovascular: Capillary refill is <3 seconds.  Respiratory: Respirations are even and unlabored.. Musculoskeletal: Unrestricted range of motion observed. Neurologic: Awake, alert, and oriented x 4.  Skin: Partial skin avulsion to the lateral edge of the right index finger with stellate laceration.  ____________________________________________   LABS (all labs ordered are listed, but only abnormal results are displayed)  Labs Reviewed - No data to display ____________________________________________  EKG  Not  indicated. ____________________________________________  RADIOLOGY  Images of the right index finger is negative for acute bony abnormality per radiology. ____________________________________________   PROCEDURES  .Marland KitchenLaceration Repair Date/Time: 02/28/2018 8:52 PM Performed by: Chinita Pester, FNP Authorized by: Chinita Pester, FNP   Consent:    Consent obtained:  Verbal   Consent given by:  Patient Anesthesia (see MAR for exact dosages):    Anesthesia method:  None Laceration details:    Length (cm):  1 Repair type:    Repair type:  Simple Exploration:    Contaminated: no   Treatment:    Area cleansed with:  Hibiclens   Amount of cleaning:  Standard   Irrigation solution:  Sterile saline   Visualized foreign bodies/material removed: no   Skin repair:    Repair method:  Tissue adhesive Approximation:    Approximation:  Close Post-procedure details:    Dressing:  Open (no dressing)   Patient tolerance of procedure:  Tolerated well, no immediate complications   ____________________________________________   INITIAL IMPRESSION / ASSESSMENT AND PLAN / ED COURSE  Tanner Diaz is a 37 y.o. male who presents to the emergency department for treatment and evaluation of injury to the right index finger sustained while cleaning his muzzle loader this evening.  The skin flap does not appear to be viable.  The wound was cleaned well and the lacerated area was covered with Dermabond.  Bleeding was stopped after this intervention.  Aluminum foam splint will be applied over the fingertip to prevent it from further injury.  The patient requested a prescription for antibiotics in case he feels that there is infection.  This is reasonable however I do not feel he needs to start the antibiotic today.  He will be advised to follow-up with his primary care provider if he does start taking the antibiotic.  He was encouraged to return to the emergency department for symptoms that change or  worsen if unable to schedule an appointment.   Medications  HYDROcodone-acetaminophen (NORCO/VICODIN) 5-325 MG per tablet 1 tablet (has no administration in time range)     Pertinent labs & imaging results that were available during my care of the patient were reviewed by me and considered in my medical decision making (see chart for details).  ____________________________________________   FINAL CLINICAL IMPRESSION(S) / ED DIAGNOSES  Final diagnoses:  Laceration of right index finger without foreign body without damage to nail, initial encounter    ED Discharge Orders         Ordered    cephALEXin (KEFLEX) 500 MG capsule  3 times daily     02/28/18 2055    traMADol (ULTRAM) 50 MG tablet  Every 6 hours PRN     02/28/18 2055           Note:  This document was prepared using Dragon voice recognition software and may include unintentional dictation errors.  Chinita Pester, FNP 02/28/18 1610    Emily Filbert, MD 02/28/18 2246    Chinita Pester, FNP 05/10/18 1932    Emily Filbert, MD 05/11/18 1231

## 2018-02-28 NOTE — Discharge Instructions (Signed)
Keep the wound clean and dry.  Wear the finger splint to protect the finger from further injury.  If you start the antibiotic, call and schedule follow-up appointment with your primary care provider.  Return to the emergency department for symptoms of change or worsen if you are unable to schedule an appointment.

## 2018-02-28 NOTE — ED Triage Notes (Signed)
Patient states he smashed his finger while cleaning his gun today.  Patient states, "the fingernail is hanging off."  Finger is bandaged at this time.  Patient is in no obvious distress at this time.

## 2018-02-28 NOTE — ED Notes (Signed)
Splint and dsd applied

## 2018-03-01 ENCOUNTER — Telehealth: Payer: Self-pay

## 2018-03-01 NOTE — Telephone Encounter (Signed)
Thanks

## 2018-03-01 NOTE — Telephone Encounter (Signed)
Called patient to follow up on how he is doing.    Finger is feeling better today and he thanks me for checking on him.  Area was glued together rather than stitched so should not need appt for removal.  He is aware to let us know if he starts taking the antibiotic or if finger is not improving per ER recommendations.

## 2018-03-01 NOTE — Telephone Encounter (Signed)
Patient called team health to request options for urgent care due to deep finger laceration with uncontrollable bleeding.  [facility] calls multiple times  Dorris Fetch, RN) 3 attempts made unsuccessful, unable to leave a voice mail as voice mail not set up.

## 2018-04-14 ENCOUNTER — Telehealth: Payer: Self-pay

## 2018-04-14 MED ORDER — OSELTAMIVIR PHOSPHATE 75 MG PO CAPS: 75.0000 mg | ORAL_CAPSULE | Freq: Every day | ORAL | 0 refills | Status: DC

## 2018-04-14 NOTE — Telephone Encounter (Signed)
Pt's daughter saw Dr Milinda Antisower today and tested positive for Flu B. Pt would like a rx for Tamiflu to [facility]. Dr Para Marchuncan is out of the office today.

## 2018-06-14 ENCOUNTER — Encounter: Payer: Self-pay | Admitting: Family Medicine

## 2018-06-14 ENCOUNTER — Ambulatory Visit: Payer: BC Managed Care – PPO | Admitting: Family Medicine

## 2018-06-14 ENCOUNTER — Telehealth: Payer: Self-pay | Admitting: *Deleted

## 2018-06-14 VITALS — BP 110/74 | HR 71 | Temp 98.6°F | Ht 70.0 in | Wt 235.0 lb

## 2018-06-14 DIAGNOSIS — R071 Chest pain on breathing: Secondary | ICD-10-CM

## 2018-06-14 DIAGNOSIS — R0789 Other chest pain: Secondary | ICD-10-CM

## 2018-06-14 NOTE — Telephone Encounter (Addendum)
Patient called stating that he is having a dull pain in his chest when he turns or tries to grab something. Patient stated that the pain is dull and feels more like a pull or spasm and started yesterday and is not constant. Patient stated that he is not having any SOB, no nausea, no left arm pain or any other symptoms. Patient stated that he has a history of acid reflux and he had a lot of caffeine this weekend. Appointment schedule with Dr Patsy Lager today at 2:40.  Advise patient that if he gets worse before the appointment to call 911 or go to the ER.

## 2018-06-14 NOTE — Progress Notes (Signed)
Dr. Karleen Hampshire T. Kiyomi Pallo, MD, CAQ Sports [facility] [address] [place], [address] Phone: [phone] Fax: [phone]  06/14/2018  Patient: Tanner Diaz, MRN: [id number], DOB: 1980-10-20, 38 y.o.  Primary Physician:  Joaquim Nam, MD   Chief Complaint  Patient presents with  . Chest Wall Pain   Subjective:   Tanner Diaz is a 38 y.o. very pleasant male patient who presents with the following:  L costochondral chest pain:   Patient had some left-sided chest pain with rotational movement this started this weekend.  It happened relatively abruptly, and it is brought on by rotational movement and pressing movement with the chest.  There is no bruising or swelling, and is not having any shortness of breath or exertional chest pain.  He is 38 years old and generally healthy.  Past Medical History, Surgical History, Social History, Family History, Problem List, Medications, and Allergies have been reviewed and updated if relevant.  Patient Active Problem List   Diagnosis Date Noted  . Foot pain 09/15/2017  . Advance care planning 09/15/2014  . Tinea versicolor 09/15/2014  . Back pain 09/15/2014  . Routine general medical examination at a health care facility 12/31/2011  . Abdominal pain 12/31/2011  . GERD (gastroesophageal reflux disease) 03/26/2011    Past Medical History:  Diagnosis Date  . GERD (gastroesophageal reflux disease)     History reviewed. No pertinent surgical history.  Social History   Socioeconomic History  . Marital status: Married    Spouse name: Not on file  . Number of children: Not on file  . Years of education: Not on file  . Highest education level: Not on file  Occupational History  . Occupation: Civil engineer, contracting: [employer]  Social Needs  . Financial resource strain: Not on file  . Food insecurity:    Worry: Not on file    Inability: Not on file  . Transportation needs:    Medical: Not on file    Non-medical: Not on file  Tobacco Use  . Smoking status: Never Smoker  . Smokeless tobacco: Never Used  Substance and Sexual Activity  . Alcohol use: No    Alcohol/week: 0.0 standard drinks  . Drug use: No  . Sexual activity: Not on file  Lifestyle  . Physical activity:    Days per week: Not on file    Minutes per session: Not on file  . Stress: Not on file  Relationships  . Social connections:    Talks on phone: Not on file    Gets together: Not on file    Attends religious service: Not on file    Active member of club or organization: Not on file    Attends meetings of clubs or organizations: Not on file    Relationship status: Not on file  . Intimate partner violence:    Fear of current or ex partner: Not on file    Emotionally abused: Not on file    Physically abused: Not on file    Forced sexual activity: Not on file  Other Topics Concern  . Not on file  Social History Narrative   [employer]   [employer] fan   Married 2004   3 daughters   Likes to hunt    Family History  Problem Relation Age of Onset  . Stroke Father   . Diabetes Father   . Diabetes Other   . Colon  cancer Neg Hx   . Prostate cancer Neg Hx     Allergies  Allergen Reactions  . Milk-Related Compounds Other (See Comments)    GI upset    Medication list reviewed and updated in full in [place] [facility].   GEN: No acute illnesses, no fevers, chills. GI: No n/v/d, eating normally Pulm: No SOB Interactive and getting along well at home.  Otherwise, ROS is as per the HPI.  Objective:   BP 110/74   Pulse 71   Temp 98.6 F (37 C) (Oral)   Ht 5\' 10"  (1.778 m)   Wt 235 lb (106.6 kg)   SpO2 98%   BMI 33.72 kg/m   GEN: WDWN, NAD, Non-toxic, A & O x 3 HEENT: Atraumatic, Normocephalic. Neck supple. No masses, No LAD. Ears and Nose: No external deformity. CV: RRR, No M/G/R. No JVD. No thrill. No extra heart sounds. Chest wall: Barrel sign is negative.  I  compressed his entire sternum, no this was tender to palpation.  He does have notable pain between approximate third and fourth ribs on L PULM: CTA B, no wheezes, crackles, rhonchi. No retractions. No resp. distress. No accessory muscle use. EXTR: No c/c/e NEURO Normal gait.  PSYCH: Normally interactive. Conversant. Not depressed or anxious appearing.  Calm demeanor.   Laboratory and Imaging Data:  Assessment and Plan:   Costochondral chest pain - Plan: EKG 12-Lead  EKG: Normal sinus rhythm. Normal axis, normal R wave progression, No acute ST elevation or depression.   Extremely reassuring history and examination.  He was worried about potential cardiac issues, and he has a completely normal EKG.  Motrin 800 mg p.o. 3 times daily for 2 to 3 weeks, relative rest and alteration of his chest workouts.  Being aware of pain as is limiting factor with his workouts.  Follow-up: No follow-ups on file.  Orders Placed This Encounter  Procedures  . EKG 12-Lead    Signed,  Kayli Beal T. Tauheed Mcfayden, MD   Outpatient Encounter Medications as of 06/14/2018  Medication Sig  . PROTEIN PO Take by mouth daily. Protein Shake 1 to 2 takes daily  . [DISCONTINUED] colchicine 0.6 MG tablet Take 1 tablet (0.6 mg total) by mouth 2 (two) times daily as needed.  . [DISCONTINUED] ibuprofen (ADVIL,MOTRIN) 800 MG tablet Take 1 tablet (800 mg total) by mouth 3 (three) times daily.  . [DISCONTINUED] oseltamivir (TAMIFLU) 75 MG capsule Take 1 capsule (75 mg total) by mouth daily.  . [DISCONTINUED] traMADol (ULTRAM) 50 MG tablet Take 1 tablet (50 mg total) by mouth every 6 (six) hours as needed.   No facility-administered encounter medications on file as of 06/14/2018.

## 2018-09-21 ENCOUNTER — Ambulatory Visit (INDEPENDENT_AMBULATORY_CARE_PROVIDER_SITE_OTHER): Payer: BC Managed Care – PPO | Admitting: Family Medicine

## 2018-09-21 ENCOUNTER — Encounter: Payer: Self-pay | Admitting: Family Medicine

## 2018-09-21 DIAGNOSIS — M542 Cervicalgia: Secondary | ICD-10-CM

## 2018-09-21 MED ORDER — CYCLOBENZAPRINE HCL 5 MG PO TABS: 5.0000 mg | ORAL_TABLET | Freq: Every evening | ORAL | 0 refills | Status: DC | PRN

## 2018-09-21 NOTE — Progress Notes (Signed)
Virtual visit completed through WebEx or similar program Patient location: home  Provider location: [place] at [facility], office   Limitations and rationale for visit method d/w patient.  Patient agreed to proceed.   CC: neck pain.   HPI:  Pandemic considerations dw pt.    He has had L sided neck pain, along the L SCM.  Not ttp, no knots in the area.  It doesn't look asymmetric to patient.  He tried ice heat and motrin.  It isn't midline posterior neck pain.  Normal neck ROM.  No R sided sx.  No FCNAVD.  Normal grip.  No trauma.   He has an old pillow.  He is thinking about getting a new pillow.  Meds and allergies reviewed.   ROS: Per HPI unless specifically indicated in ROS section   NAD Speech wnl Normal neck range of motion on video exam.  A/P:  Likely SCM strain.  Can try flexeril and change pillows, ice may help more than heat.  Update me as needed.

## 2018-09-22 DIAGNOSIS — M542 Cervicalgia: Secondary | ICD-10-CM | POA: Insufficient documentation

## 2018-09-22 NOTE — Assessment & Plan Note (Signed)
Likely SCM strain.  Can try flexeril and change pillows, ice may help more than heat.  Update me as needed.  Anatomy discussed with patient.  He agrees with plan.

## 2019-08-10 ENCOUNTER — Ambulatory Visit: Payer: BC Managed Care – PPO | Admitting: Family Medicine

## 2019-08-10 ENCOUNTER — Other Ambulatory Visit: Payer: Self-pay

## 2019-08-10 ENCOUNTER — Encounter: Payer: Self-pay | Admitting: Family Medicine

## 2019-08-10 ENCOUNTER — Ambulatory Visit (INDEPENDENT_AMBULATORY_CARE_PROVIDER_SITE_OTHER)
Admission: RE | Admit: 2019-08-10 | Discharge: 2019-08-10 | Disposition: A | Discharge status: Home / Self Care | Payer: BC Managed Care – PPO | Source: Ambulatory Visit | Attending: Family Medicine | Admitting: Family Medicine

## 2019-08-10 VITALS — BP 120/90 | HR 95 | Temp 98.3°F | Ht 68.25 in | Wt 236.5 lb

## 2019-08-10 DIAGNOSIS — M7542 Impingement syndrome of left shoulder: Secondary | ICD-10-CM

## 2019-08-10 DIAGNOSIS — M25512 Pain in left shoulder: Secondary | ICD-10-CM

## 2019-08-10 NOTE — Progress Notes (Signed)
Traeton Bordas T. Meg Niemeier, MD, [place] at [facility] [place], [address]  Phone: [phone]  FAX: [place] - 39 y.o. male  MRN [id number]  Date of Birth: 09-02-1980  Date: 08/10/2019  PCP: Tonia Ghent, MD  Referral: Tonia Ghent, MD  Chief Complaint  Patient presents with  . Shoulder Pain    Left x 3 months    This visit occurred during the SARS-CoV-2 public health emergency.  Safety protocols were in place, including screening questions prior to the visit, additional usage of staff PPE, and extensive cleaning of exam room while observing appropriate contact time as indicated for disinfecting solutions.   Subjective:   LYNX GOODRICH is a 39 y.o. very pleasant male patient with Body mass index is 35.7 kg/m. who presents with the following:  He has had an insidious onset of some shoulder pain for about 3 months.  His benchpress as well as doing push-ups and overhead presses hurt him quite a bit now.  He is having pain she even now doing some basic activities in the plane of abduction internal range of motion.  He also sleeps on his left side and this has become quite difficult and the past few months.  His pain is lateral as well as anterior.  He is not having any numbness or tingling.  No radicular symptoms or neck pain.  No shoulder blade pain.  He has no history of any significant prior traumatic fracture, dislocation, or operative.  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  He has not had an injury.  Review of Systems is noted in the HPI, as appropriate   Objective:   BP 120/90   Pulse 95   Temp 98.3 F (36.8 C) (Temporal)   Ht 5' 8.25" (1.734 m)   Wt 236 lb 8 oz (107.3 kg)   SpO2 95%   BMI 35.70 kg/m   Weight in (lb) to have BMI = 25: 165.3    GEN: No acute  distress; alert,appropriate. PULM: Breathing comfortably in no respiratory distress PSYCH: Normally interactive.   Shoulder: Left Inspection: No muscle wasting or winging Ecchymosis/edema: neg  AC joint, scapula, clavicle: NT Cervical spine: NT, full ROM Spurling's: neg Abduction: full, 5/5, painful arc of motion Flexion: full, 5/5 IR, full, lift-off: 5/5 ER at neutral: full, 5/5 AC crossover and compression: Mildly positive on the left Neer: Mild positive  Hawkins: Positive O'Brien's testing is mildly positive. Drop Test: neg Empty Can: neg Supraspinatus insertion: NT Bicipital groove: NT Speed's: neg Yergason's: neg Sulcus sign: neg Scapular dyskinesis: none C5-T1 intact Sensation intact Grip 5/5   Radiology: DG Shoulder Left  Result Date: 08/10/2019 CLINICAL DATA:  Shoulder pain for 3 months EXAM: LEFT SHOULDER - 2+ VIEW COMPARISON:  None. FINDINGS: Frontal, transscapular, and axillary views of the left shoulder are obtained. Alignment is anatomic. Joint spaces are well preserved. No fractures. Left chest is clear. IMPRESSION: 1. Unremarkable left shoulder. Electronically Signed   By: Randa Ngo M.D.   On: 08/10/2019 22:14    Assessment and Plan:     ICD-10-CM   1. Impingement syndrome of left shoulder  M75.42 Ambulatory referral to Physical Therapy  2. Left shoulder pain, unspecified chronicity  M25.512 DG Shoulder Left    Ambulatory referral to Physical Therapy  Anatomical he does have quite large deltoids which were already humeral elevator.  Does have some forward sloping scapulas.  Exam and history are consistent with some impingement and rotator cuff tendinopathy in the plane of abduction, supraspinatus.  I gave him the moon shoulder protocol, and we reviewed this together.  PT for several visits to assist with scapular stabilization and rotator cuff work.  For now decrease in shoulder loading activities and progress.  I suspect that he will need to do  some scapular stabilization and rotator cuff work for the remainder of his life to maintain weight.  Follow-up: 6 to 8 weeks  No orders of the defined types were placed in this encounter.  Medications Discontinued During This Encounter  Medication Reason  . cyclobenzaprine (FLEXERIL) 5 MG tablet Completed Course   Orders Placed This Encounter  Procedures  . DG Shoulder Left  . Ambulatory referral to Physical Therapy    Signed,  Karleen Hampshire T. Mayuri Staples, MD   Outpatient Encounter Medications as of 08/10/2019  Medication Sig  . Probiotic Product (PROBIOTIC PO) Take 1 tablet by mouth daily.  Marland Kitchen PROTEIN PO Take by mouth daily. Protein Shake 1 to 2 takes daily  . [DISCONTINUED] cyclobenzaprine (FLEXERIL) 5 MG tablet Take 1 tablet (5 mg total) by mouth at bedtime as needed for muscle spasms (sedation caution).   No facility-administered encounter medications on file as of 08/10/2019.

## 2019-08-10 NOTE — Patient Instructions (Addendum)
  Diclofenac 1% gel, up to 4 times a day  Supplements: Tart cherry juice and Curcumin (Turmeric extract) have good scientific evidence  Glucosamine and Chondroitin often helpful - will take about 3 months to see if you have an effect. If you do, great, keep them up, if none at that point, no need to take in the future.

## 2019-08-12 ENCOUNTER — Telehealth: Payer: Self-pay | Admitting: Family Medicine

## 2019-08-12 NOTE — Telephone Encounter (Signed)
Pt needs a note for work stating how long he will need to have PT and how many times a week he will need to go.

## 2019-08-16 NOTE — Telephone Encounter (Signed)
Patient called about work not stating how long he'll need to be doing physical therapy.  Patient had physical therapy today for his initial evaluation and they told him 1 x a week for 3-4 weeks. Patient needs note as soon as possible.

## 2019-08-16 NOTE — Telephone Encounter (Signed)
Work note written as requested.  Tanner Diaz notified by telephone that the letter is ready to be picked up at the front desk.

## 2019-09-09 IMAGING — CR DG FINGER INDEX 2+V*R*
1 series · 3 of 3 positions shown · non-contrast
Comparison: None.

CLINICAL DATA: Crush injury to the second finger, initial encounter

EXAM:
RIGHT INDEX FINGER 2+V

[Series 1: dg finger index right · 0.14mm/px · 3 of 3 slices shown]
[im 1/3]
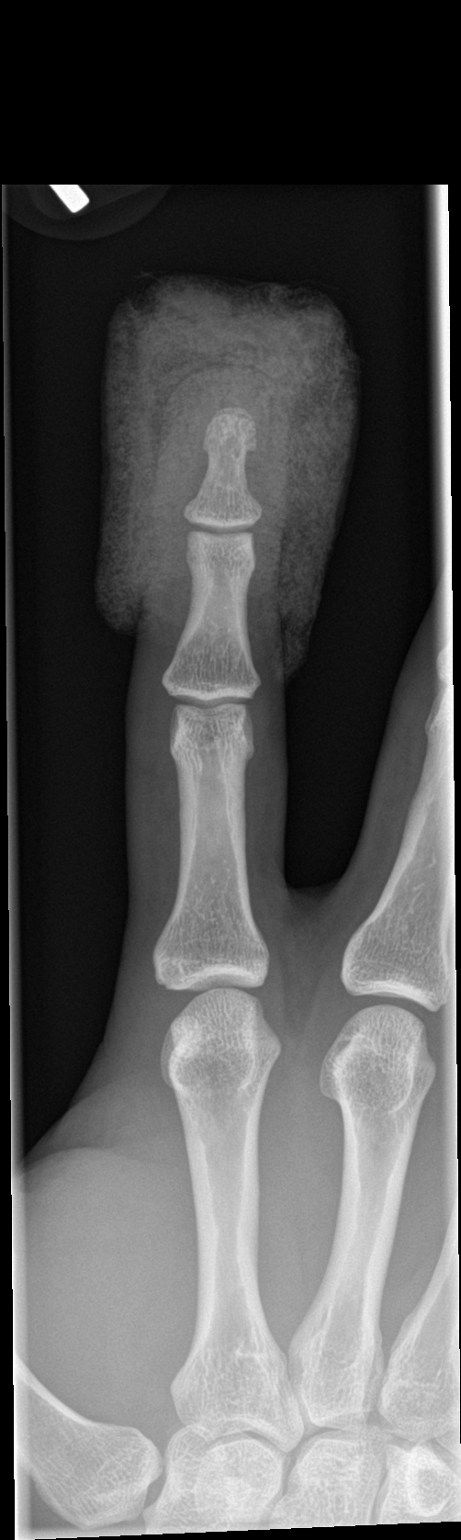
[im 2/3]
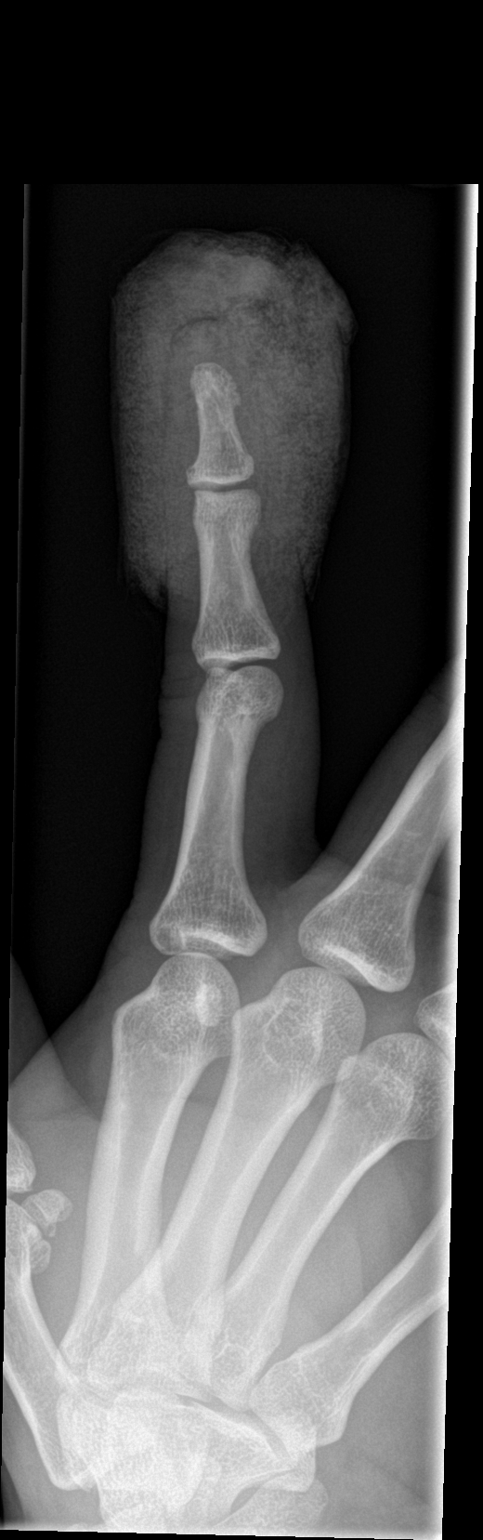
[im 3/3]
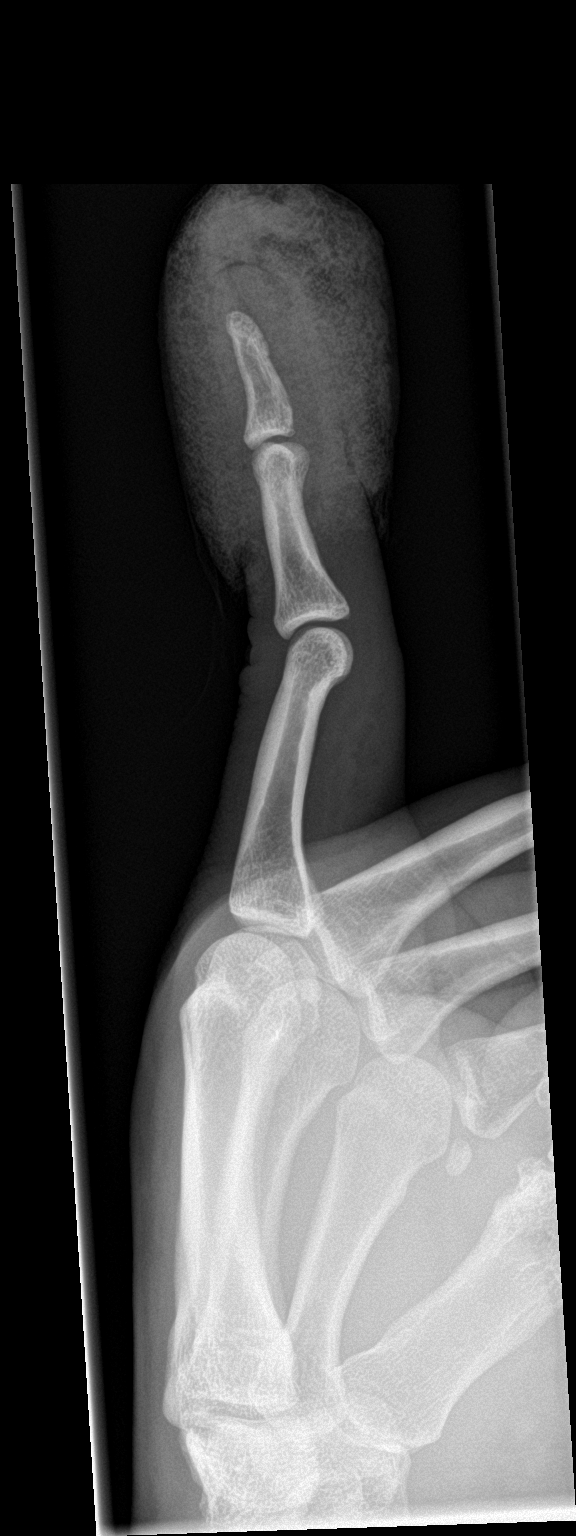

[3 of 3 positions shown; findings below may reference images not displayed]

FINDINGS: Mild soft tissue swelling is noted distally related to the recent
injury. No definitive fracture or dislocation is seen.
IMPRESSION: Mild soft tissue swelling without acute bony abnormality.

## 2019-09-27 NOTE — Progress Notes (Signed)
Tanner Call T. Chikita Dogan, MD, CAQ Sports Medicine  Primary Care and Sports Medicine [facility] at [facility] [address] [place], [address]  Phone: [phone]  FAX: [phone]  Tanner Diaz - 39 y.o. male  MRN [id number]  Date of Birth: Jun 25, 1980  Date: 09/28/2019  PCP: Joaquim Nam, MD  Referral: Joaquim Nam, MD  Chief Complaint  Patient presents with  . Follow-up    Left Shoulder    This visit occurred during the SARS-CoV-2 public health emergency.  Safety protocols were in place, including screening questions prior to the visit, additional usage of staff PPE, and extensive cleaning of exam room while observing appropriate contact time as indicated for disinfecting solutions.   Subjective:   Tanner Diaz is a 39 y.o. very pleasant male patient with Body mass index is 35.32 kg/m. who presents with the following:  I did note that he had some scapular dysfunction on his last exam and I referred him to some formal physical therapy and reviewed some weight lifting alteration.  He is here in follow-up today.  Did do some bench and just with the bar.  Now with laying on it, it will hurt a lot.   In law enforencement.  Right now he continues to have pain with motion at the shoulder, and typically was able to lift some quite heavy weights even in recent times, but now is having difficulty doing even a push-up and doing bench presses with the bar.  inj shoulder,  Left    08/10/2019 Last OV with Hannah Beat, MD  He has had an insidious onset of some shoulder pain for about 3 months.  His benchpress as well as doing push-ups and overhead presses hurt him quite a bit now.  He is having pain she even now doing some basic activities in the plane of abduction internal range of motion.  He also sleeps on his left side and this has become quite difficult and the past few months.   His pain is lateral as well as anterior.  He is not having any  numbness or tingling.  No radicular symptoms or neck pain.  No shoulder blade pain.  He has no history of any significant prior traumatic fracture, dislocation, or operative.   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   He has not had an injury.   Review of Systems is noted in the HPI, as appropriate   Objective:   BP 110/80   Pulse 75   Temp 98.1 F (36.7 C) (Temporal)   Ht 5' 8.25" (1.734 m)   Wt 234 lb (106.1 kg)   SpO2 95%   BMI 35.32 kg/m    GEN: No acute distress; alert,appropriate. PULM: Breathing comfortably in no respiratory distress PSYCH: Normally interactive.   Shoulder: L Inspection: No muscle wasting or winging Ecchymosis/edema: neg  AC joint, scapula, clavicle: NT Cervical spine: NT, full ROM Spurling's: neg Abduction: full, 5/5 Flexion: full, 5/5 IR, full, lift-off: 5/5 ER at neutral: full, 5/5 AC crossover: neg Neer: pos Hawkins: pos Drop Test: neg Empty Can: pos Supraspinatus insertion: mild-mod T Bicipital groove: NT Speed's: neg Yergason's: neg Sulcus sign: neg Scapular dyskinesis: none C5-T1 intact  Neuro: Sensation intact Grip 5/5   Radiology: DG Shoulder Left  Result Date: 08/10/2019 CLINICAL DATA:  Shoulder pain for 3 months EXAM: LEFT SHOULDER - 2+ VIEW COMPARISON:  None. FINDINGS: Frontal, transscapular, and axillary views of the left shoulder are obtained. Alignment is anatomic. Joint spaces are well preserved. No fractures. Left chest is clear. IMPRESSION: 1. Unremarkable left shoulder. Electronically Signed   By: Randa Ngo M.D.   On: 08/10/2019 22:14   Assessment and Plan:     ICD-10-CM   1. Impingement syndrome of left shoulder  M75.42 methylPREDNISolone acetate (DEPO-MEDROL) injection 80 mg  2. Left shoulder pain, unspecified chronicity  M25.512    Pleasant gentleman with some ongoing deep shoulder pain and pain with internal rotation to  a lesser extent abduction.  Is also having some limitation functionally with his ability to work out in the gym.  He also has employment with [employer], so I gave him a letter to restrict activity to nonimpactor or potentially noncombative situations.  For now continue with home exercise program with good follow-up.  Intraarticular Shoulder Aspiration/Injection Procedure Note Tanner Diaz 06-06-1980 Date of procedure: 09/28/2019  Procedure: Large Joint Aspiration / Injection of Shoulder, Intraarticular, L Indications: Pain  Procedure Details Verbal consent was obtained from the patient. Risks including infection explained and contrasted with benefits and alternatives. Patient prepped with Chloraprep and Ethyl Chloride used for anesthesia. An intraarticular shoulder injection was performed using the posterior approach; needle placed into joint capsule without difficulty. The patient tolerated the procedure well and had decreased pain post injection. No complications. Injection: 8 cc of Lidocaine 1% and 2 mL Depo-Medrol 40 mg. Needle: 21 gauge, 2 inch  Follow-up: Return in about 1 month (around 10/28/2019).  Meds ordered this encounter  Medications  . methylPREDNISolone acetate (DEPO-MEDROL) injection 80 mg   There are no discontinued medications. No orders of the defined types were placed in this encounter.   Signed,  Maud Deed. Lavonda Thal, MD   Outpatient Encounter Medications as of 09/28/2019  Medication Sig  . Probiotic Product (PROBIOTIC PO) Take 1 tablet by mouth daily.  Marland Kitchen PROTEIN PO Take by mouth daily. Protein Shake 1 to 2 takes daily  . [EXPIRED] methylPREDNISolone acetate (DEPO-MEDROL) injection 80 mg    No facility-administered encounter medications on file as of 09/28/2019.

## 2019-09-28 ENCOUNTER — Ambulatory Visit: Payer: BC Managed Care – PPO | Admitting: Family Medicine

## 2019-09-28 ENCOUNTER — Encounter: Payer: Self-pay | Admitting: Family Medicine

## 2019-09-28 ENCOUNTER — Other Ambulatory Visit: Payer: Self-pay

## 2019-09-28 VITALS — BP 110/80 | HR 75 | Temp 98.1°F | Ht 68.25 in | Wt 234.0 lb

## 2019-09-28 DIAGNOSIS — M7542 Impingement syndrome of left shoulder: Secondary | ICD-10-CM | POA: Diagnosis not present

## 2019-09-28 DIAGNOSIS — M25512 Pain in left shoulder: Secondary | ICD-10-CM | POA: Diagnosis not present

## 2019-09-28 MED ORDER — METHYLPREDNISOLONE ACETATE 40 MG/ML IJ SUSP
80.0000 mg | Freq: Once | INTRAMUSCULAR | Status: AC
  Administered 2019-09-28: 80 mg via INTRA_ARTICULAR

## 2019-11-07 ENCOUNTER — Encounter: Payer: Self-pay | Admitting: Family Medicine

## 2019-11-07 ENCOUNTER — Telehealth (INDEPENDENT_AMBULATORY_CARE_PROVIDER_SITE_OTHER): Payer: BC Managed Care – PPO | Admitting: Family Medicine

## 2019-11-07 ENCOUNTER — Other Ambulatory Visit: Payer: Self-pay

## 2019-11-07 VITALS — Ht 68.25 in

## 2019-11-07 DIAGNOSIS — M25512 Pain in left shoulder: Secondary | ICD-10-CM | POA: Diagnosis not present

## 2019-11-07 DIAGNOSIS — M7542 Impingement syndrome of left shoulder: Secondary | ICD-10-CM | POA: Diagnosis not present

## 2019-11-07 NOTE — Progress Notes (Signed)
     Irini Leet T. Tayron Hunnell, MD Primary Care and Sports Medicine [facility] at [facility] [address] [place], [address] Phone: [phone]  FAX: [phone]  Tanner Diaz - 39 y.o. male  MRN [id number]  Date of Birth: [birth year]  Visit Date: 11/07/2019  PCP: Joaquim Nam, MD  Referred by: Joaquim Nam, MD Chief Complaint  Patient presents with  . Follow-up    Left shoulder   Virtual Visit via Video Note:  I connected with  Tanner Diaz on 11/07/2019  3:20 PM EDT by a video enabled telemedicine application and verified that I am speaking with the correct person using two identifiers.   Location patient: home computer, tablet, or smartphone Location provider: work or home office Consent: Verbal consent directly obtained from [employer]. Persons participating in the virtual visit: patient, provider  I discussed the limitations of evaluation and management by telemedicine and the availability of in person appointments. The patient expressed understanding and agreed to proceed.  History of Present Illness:  Ongoing l shoulder pain s/p shoulder injection on his last office visit on 09/28/2019.  He has also done a course of some physical therapy.  On his last office visit he was having quite a bit of difficulty with abduction and internal range of motion.  On his last office visit, I did do a corticosteroid injection and he has done quite a bit better and is been diligent about doing his home rehab.  At this point he is not having much significant pain and he has done some light work with bench press and push-ups without any difficulty.  Review of Systems as above: See pertinent positives and pertinent negatives per HPI No acute distress verbally   Observations/Objective/Exam:  An attempt was made to discern vital signs over the phone and per patient if applicable and possible.   General:    Alert, Oriented, appears well and in no acute  distress  Pulmonary:     On inspection no signs of respiratory distress.  Psych / Neurological:     Pleasant and cooperative.  Assessment and Plan:    ICD-10-CM   1. Impingement syndrome of left shoulder  M75.42   2. Left shoulder pain, unspecified chronicity  M25.512    At this point, I think that he can return to full duty without limitations.  I wrote him a note for his place of work.  He only needs to see me on a as needed basis.  I discussed the assessment and treatment plan with the patient. The patient was provided an opportunity to ask questions and all were answered. The patient agreed with the plan and demonstrated an understanding of the instructions.    Signed,  Elpidio Galea. Nyree Yonker, MD

## 2019-11-08 ENCOUNTER — Encounter: Payer: Self-pay | Admitting: Family Medicine

## 2019-11-09 ENCOUNTER — Ambulatory Visit: Payer: BC Managed Care – PPO | Admitting: Family Medicine

## 2020-09-20 ENCOUNTER — Telehealth: Payer: Self-pay

## 2020-09-20 NOTE — Telephone Encounter (Signed)
I spoke with pt; pt said starting on 09/19/20 pt was having mid sharp lower abd pain that only last few seconds on and off. Pt had same pain 4-5 months ago when pt did not take probiotics for 4 - 5 days. Same thing happened this time.pt restarted probiotic on 09/16/20; pt had BM on 09/18/20 and again this morning and pt is not hurting today but pt said he usually has BM daily and wants to talk with doctor about this since has happened x 2 and does not want to wait until next wk to be seen.pt scheduled in office appt with Dr Reece Agar on 09/21/20 at 2 PM. No covid symptoms or known exposure to covid. UC & ED precautions given and pt voiced understanding.sending note to Dr Para March as PCP and Dr Sharen Hones.

## 2020-09-20 NOTE — Telephone Encounter (Signed)
[facility] Day - Client TELEPHONE ADVICE RECORD [facility] Patient Name: Tanner Diaz Gender: Male DOB: 17-Feb-1981 Age: 40 Y 4 M 11 D Return Phone Number: [phone] (Primary) Address: City/ State/ Zip: [place] [address] Client [facility] - Client Client Site [facility] - Day Physician Raechel Ache - MD Contact Type Call Who Is Calling Patient / Member / Family / Caregiver Call Type Triage / Clinical Relationship To Patient Self Return Phone Number [phone] (Primary) Chief Complaint Abdominal Pain Reason for Call Symptomatic / Request for Health Information Initial Comment Caller states that he is having lower abdominal pain that began yesterday. No fever, no other symptoms at this time. Translation No Nurse Assessment Nurse: Chilton Si, RN, Tanika Date/Time (Eastern Time): 09/20/2020 11:05:35 AM Confirm and document reason for call. If symptomatic, describe symptoms. ---Caller states that he is having lower abdominal pain that began yesterday. No fever, no other symptoms at this time. Does the patient have any new or worsening symptoms? ---Yes Will a triage be completed? ---Yes Related visit to physician within the last 2 weeks? ---No Does the PT have any chronic conditions? (i.e. diabetes, asthma, this includes High risk factors for pregnancy, etc.) ---No Is this a behavioral health or substance abuse call? ---No Guidelines Guideline Title Affirmed Question Affirmed Notes Nurse Date/Time Lamount Cohen Time) Abdominal Pain - Male [1] MILDMODERATE pain AND [2] comes and goes (cramps) Chilton Si, RN, Alcario Drought 09/20/2020 11:06:00 AM Disp. Time Lamount Cohen Time) Disposition Final User 09/20/2020 11:10:21 AM Home Care Yes Chilton Si, RN, Tanika PLEASE NOTE: All timestamps contained within this report are represented as [place] Standard Time. CONFIDENTIALTY NOTICE: This fax transmission is intended  only for the addressee. It contains information that is legally privileged, confidential or otherwise protected from use or disclosure. If you are not the intended recipient, you are strictly prohibited from reviewing, disclosing, copying using or disseminating any of this information or taking any action in reliance on or regarding this information. If you have received this fax in error, please notify us immediately by telephone so that we can arrange for its return to [place]. Phone: [phone], Toll-Free: [phone], Fax: [phone] Page: 2 of 2 Call Id: [id number] Caller Disagree/Comply Comply Caller Understands Yes PreDisposition Did not know what to do Care Advice Given Per Guideline * You should be able to treat this at home. HOME CARE: REASSURANCE AND EDUCATION - STOMACH PAIN: * It doesn't sound like a serious stomachache. CARE ADVICE given per Abdominal Pain, Male (Adult) guideline. * Drink clear fluids only (e.g., water, flat soft drinks or half-strength Gatorade). DRINK CLEAR FLUIDS: * You become worse * Constant pain lasts over 2 hours * Intermittent pain (e.g., comes and goes, cramps) lasts over 48 hours CALL BACK IF:

## 2020-09-21 ENCOUNTER — Ambulatory Visit: Payer: BC Managed Care – PPO | Admitting: Family Medicine

## 2020-09-21 NOTE — Telephone Encounter (Signed)
Noted. Thanks.  App Dr. Timoteo Expose input.

## 2021-02-18 IMAGING — DX DG SHOULDER 2+V*L*
2 series · 3 of 3 positions shown · non-contrast
Comparison: None.

CLINICAL DATA: Shoulder pain for 3 months

EXAM:
LEFT SHOULDER - 2+ VIEW

[Series 1: shoulder axial · 0.14mm/px · 2 of 2 slices shown]
[im 1/2]
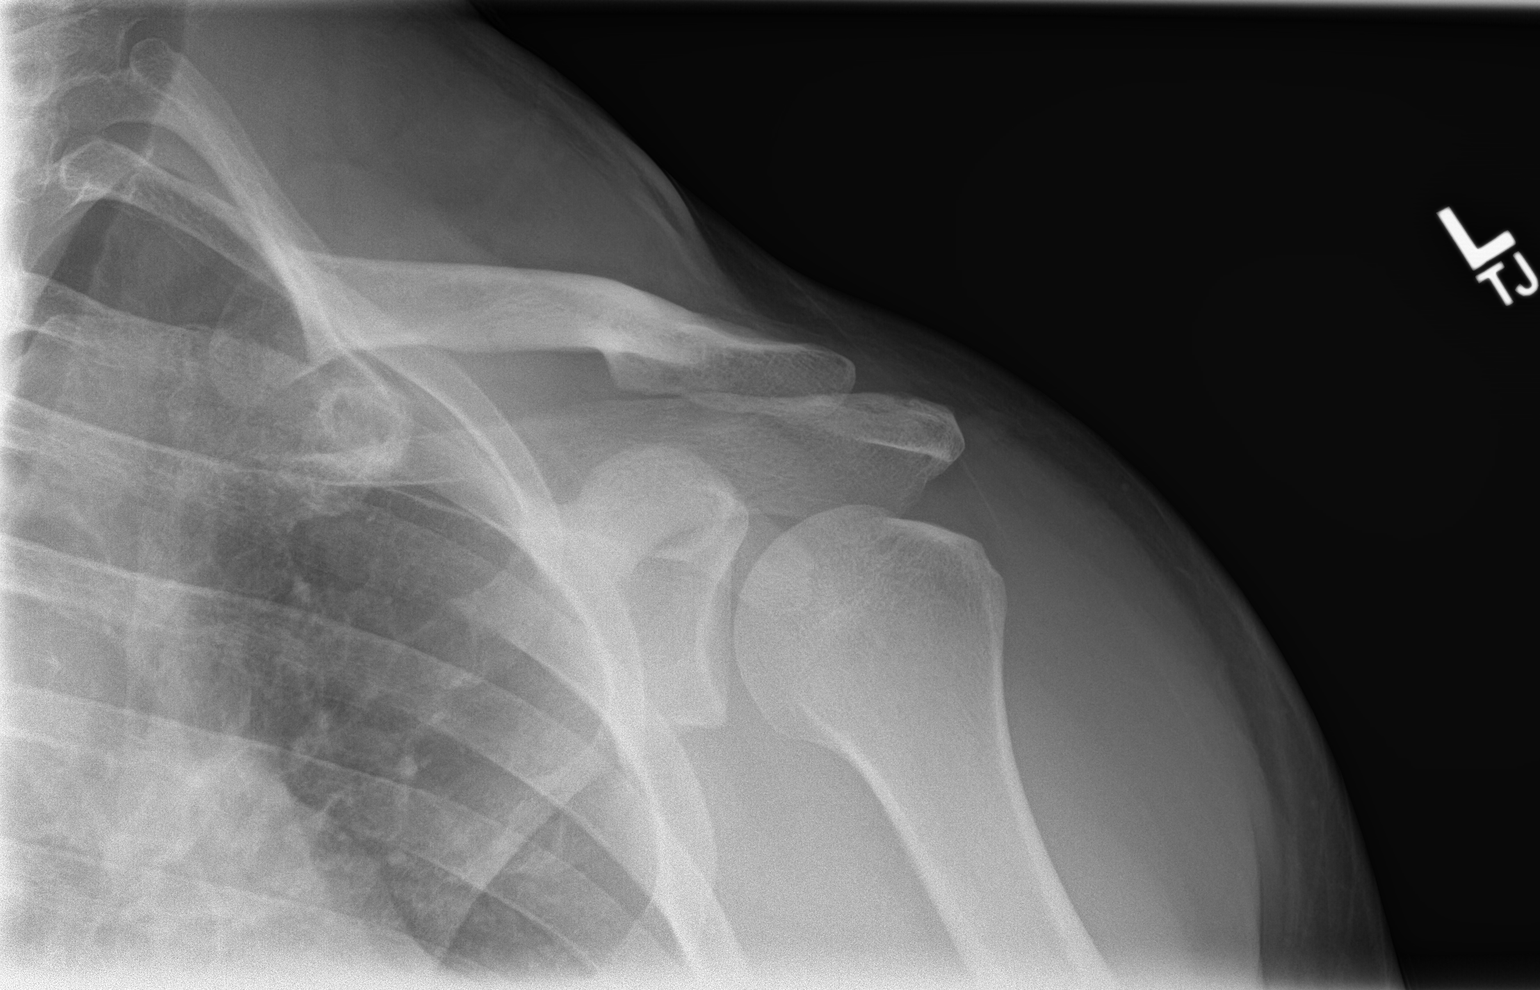
[im 2/2]
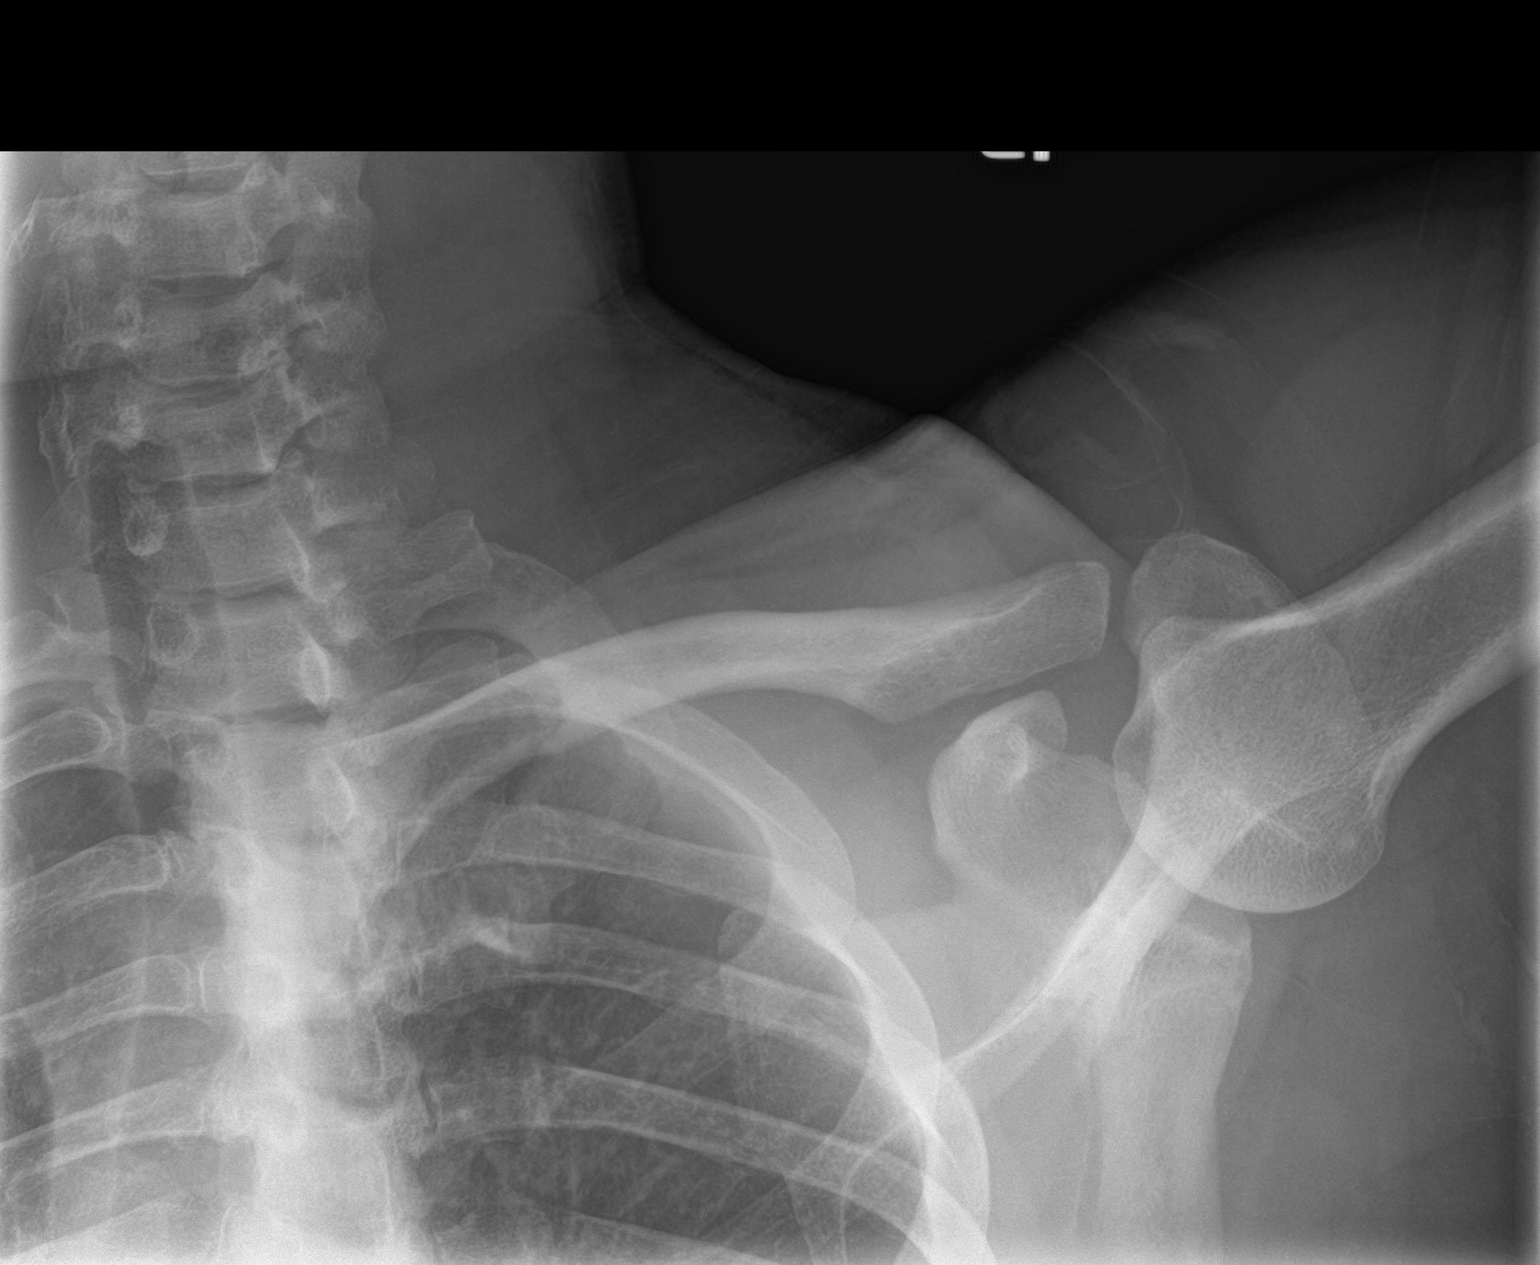

[shoulder y-view]
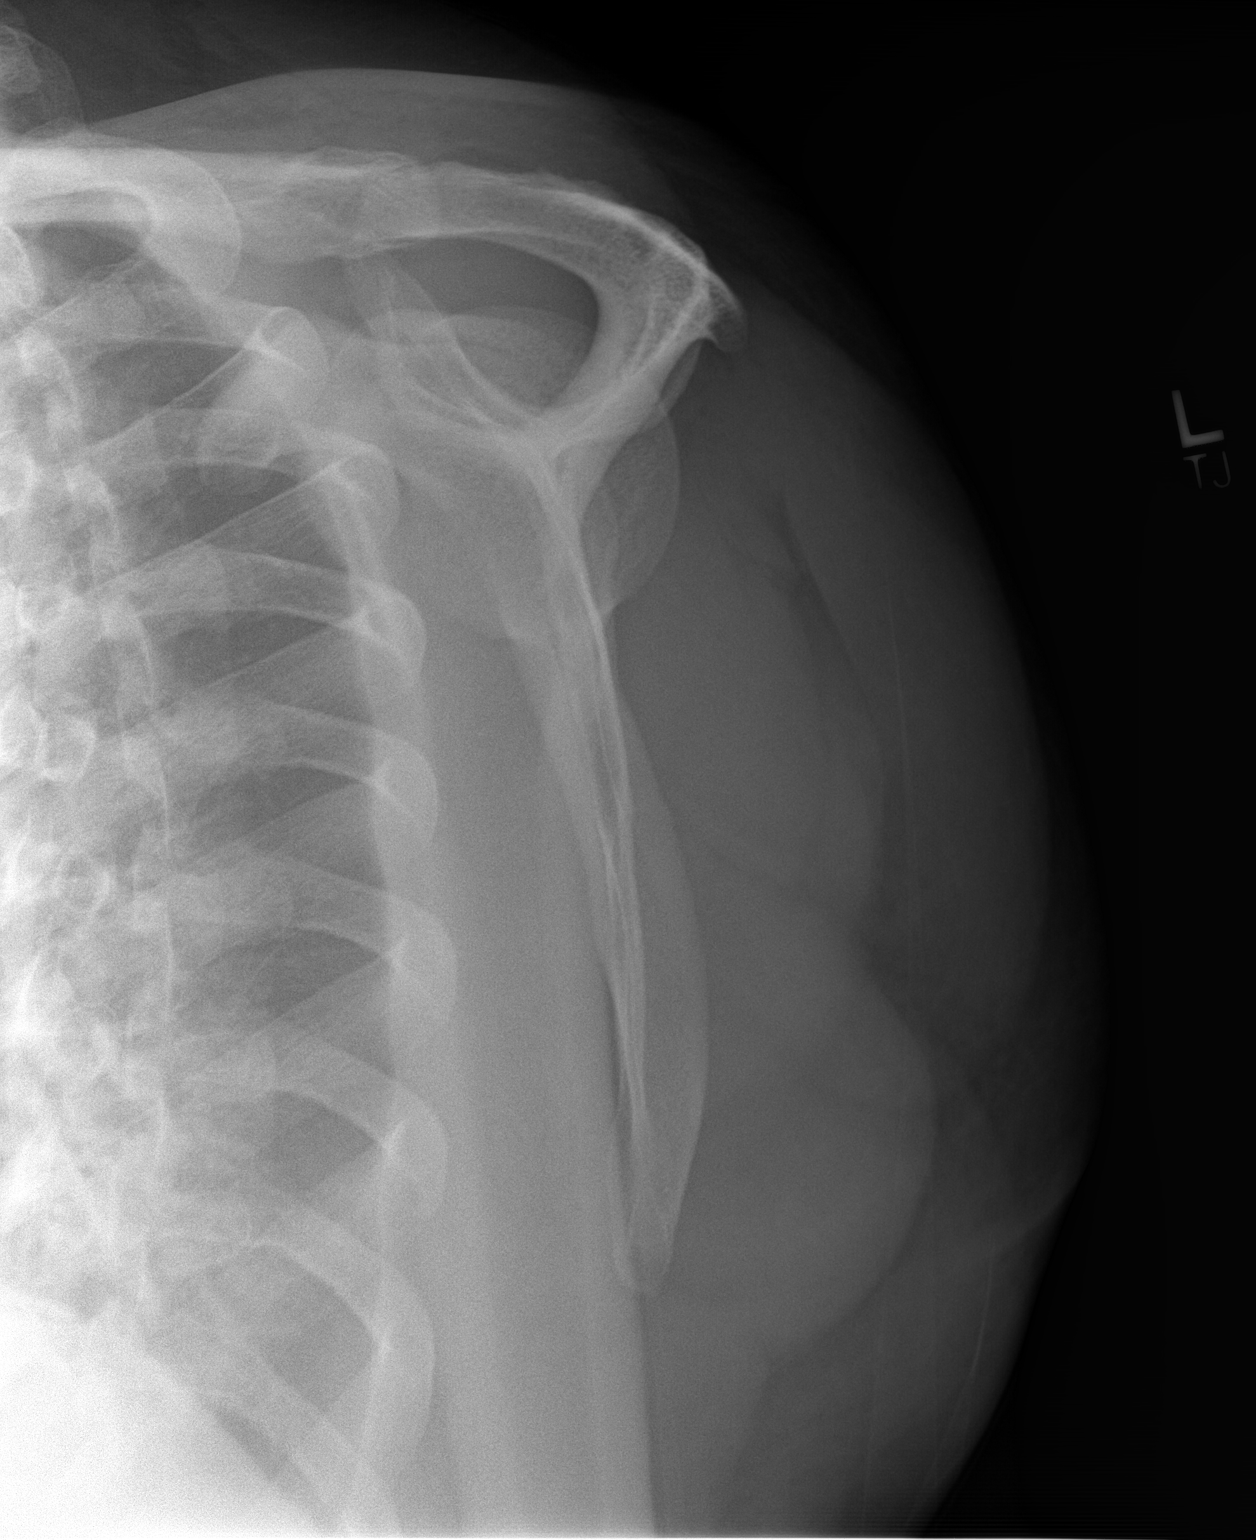

[3 of 3 positions shown; findings below may reference images not displayed]

FINDINGS: Frontal, transscapular, and axillary views of the left shoulder are
obtained. Alignment is anatomic. Joint spaces are well preserved. No
fractures. Left chest is clear.
IMPRESSION: 1. Unremarkable left shoulder.

## 2021-03-06 ENCOUNTER — Other Ambulatory Visit: Payer: Self-pay

## 2021-03-06 ENCOUNTER — Encounter: Payer: Self-pay | Admitting: Family Medicine

## 2021-03-06 ENCOUNTER — Ambulatory Visit (INDEPENDENT_AMBULATORY_CARE_PROVIDER_SITE_OTHER): Payer: BC Managed Care – PPO | Admitting: Family Medicine

## 2021-03-06 ENCOUNTER — Telehealth: Payer: Self-pay

## 2021-03-06 VITALS — BP 120/84 | HR 78 | Temp 96.1°F | Ht 70.0 in | Wt 247.1 lb

## 2021-03-06 DIAGNOSIS — S39012A Strain of muscle, fascia and tendon of lower back, initial encounter: Secondary | ICD-10-CM | POA: Diagnosis not present

## 2021-03-06 MED ORDER — MELOXICAM 7.5 MG PO TABS: 7.5000 mg | ORAL_TABLET | Freq: Every day | ORAL | 0 refills | Status: DC

## 2021-03-06 MED ORDER — CYCLOBENZAPRINE HCL 5 MG PO TABS: 5.0000 mg | ORAL_TABLET | Freq: Three times a day (TID) | ORAL | 0 refills | Status: DC | PRN

## 2021-03-06 NOTE — Patient Instructions (Addendum)
Back pain - Take flexeril - muscle relaxant - try at night first - Meloxicam once daily - do not take with ibuprofen, ok for tylenol - If no improvement in 1-2 weeks would recommend physical - If weakness, tingling, numbness - return could be sign of disc injury - May take 2-6 weeks to full recover  Low Back Sprain or Strain Rehab Ask your health care provider which exercises are safe for you. Do exercises exactly as told by your health care provider and adjust them as directed. It is normal to feel mild stretching, pulling, tightness, or discomfort as you do these exercises. Stop right away if you feel sudden pain or your pain gets worse. Do not begin these exercises until told by your health care provider. Stretching and range-of-motion exercises These exercises warm up your muscles and joints and improve the movement and flexibility of your back. These exercises also help to relieve pain, numbness, and tingling. Lumbar rotation  Lie on your back on a firm bed or the floor with your knees bent. Straighten your arms out to your sides so each arm forms a 90-degree angle (right angle) with a side of your body. Slowly move (rotate) both of your knees to one side of your body until you feel a stretch in your lower back (lumbar). Try not to let your shoulders lift off the floor. Hold this position for __________ seconds. Tense your abdominal muscles and slowly move your knees back to the starting position. Repeat this exercise on the other side of your body. Repeat __________ times. Complete this exercise __________ times a day. Single knee to chest  Lie on your back on a firm bed or the floor with both legs straight. Bend one of your knees. Use your hands to move your knee up toward your chest until you feel a gentle stretch in your lower back and buttock. Hold your leg in this position by holding on to the front of your knee. Keep your other leg as straight as possible. Hold this position for  __________ seconds. Slowly return to the starting position. Repeat with your other leg. Repeat __________ times. Complete this exercise __________ times a day. Prone extension on elbows  Lie on your abdomen on a firm bed or the floor (prone position). Prop yourself up on your elbows. Use your arms to help lift your chest up until you feel a gentle stretch in your abdomen and your lower back. This will place some of your body weight on your elbows. If this is uncomfortable, try stacking pillows under your chest. Your hips should stay down, against the surface that you are lying on. Keep your hip and back muscles relaxed. Hold this position for __________ seconds. Slowly relax your upper body and return to the starting position. Repeat __________ times. Complete this exercise __________ times a day. Strengthening exercises These exercises build strength and endurance in your back. Endurance is the ability to use your muscles for a long time, even after they get tired. Pelvic tilt This exercise strengthens the muscles that lie deep in the abdomen. Lie on your back on a firm bed or the floor with your legs extended. Bend your knees so they are pointing toward the ceiling and your feet are flat on the floor. Tighten your lower abdominal muscles to press your lower back against the floor. This motion will tilt your pelvis so your tailbone points up toward the ceiling instead of pointing to your feet or the floor. To help  with this exercise, you may place a small towel under your lower back and try to push your back into the towel. Hold this position for __________ seconds. Let your muscles relax completely before you repeat this exercise. Repeat __________ times. Complete this exercise __________ times a day. Alternating arm and leg raises  Get on your hands and knees on a firm surface. If you are on a hard floor, you may want to use padding, such as an exercise mat, to cushion your knees. Line  up your arms and legs. Your hands should be directly below your shoulders, and your knees should be directly below your hips. Lift your left leg behind you. At the same time, raise your right arm and straighten it in front of you. Do not lift your leg higher than your hip. Do not lift your arm higher than your shoulder. Keep your abdominal and back muscles tight. Keep your hips facing the ground. Do not arch your back. Keep your balance carefully, and do not hold your breath. Hold this position for __________ seconds. Slowly return to the starting position. Repeat with your right leg and your left arm. Repeat __________ times. Complete this exercise __________ times a day. Abdominal set with straight leg raise  Lie on your back on a firm bed or the floor. Bend one of your knees and keep your other leg straight. Tense your abdominal muscles and lift your straight leg up, 4-6 inches (10-15 cm) off the ground. Keep your abdominal muscles tight and hold this position for __________ seconds. Do not hold your breath. Do not arch your back. Keep it flat against the ground. Keep your abdominal muscles tense as you slowly lower your leg back to the starting position. Repeat with your other leg. Repeat __________ times. Complete this exercise __________ times a day. Single leg lower with bent knees Lie on your back on a firm bed or the floor. Tense your abdominal muscles and lift your feet off the floor, one foot at a time, so your knees and hips are bent in 90-degree angles (right angles). Your knees should be over your hips and your lower legs should be parallel to the floor. Keeping your abdominal muscles tense and your knee bent, slowly lower one of your legs so your toe touches the ground. Lift your leg back up to return to the starting position. Do not hold your breath. Do not let your back arch. Keep your back flat against the ground. Repeat with your other leg. Repeat __________ times.  Complete this exercise __________ times a day. Posture and body mechanics Good posture and healthy body mechanics can help to relieve stress in your body's tissues and joints. Body mechanics refers to the movements and positions of your body while you do your daily activities. Posture is part of body mechanics. Good posture means: Your spine is in its natural S-curve position (neutral). Your shoulders are pulled back slightly. Your head is not tipped forward (neutral). Follow these guidelines to improve your posture and body mechanics in your everyday activities. Standing  When standing, keep your spine neutral and your feet about hip-width apart. Keep a slight bend in your knees. Your ears, shoulders, and hips should line up. When you do a task in which you stand in one place for a long time, place one foot up on a stable object that is 2-4 inches (5-10 cm) high, such as a footstool. This helps keep your spine neutral. Sitting  When sitting, keep your  spine neutral and keep your feet flat on the floor. Use a footrest, if necessary, and keep your thighs parallel to the floor. Avoid rounding your shoulders, and avoid tilting your head forward. When working at a desk or a computer, keep your desk at a height where your hands are slightly lower than your elbows. Slide your chair under your desk so you are close enough to maintain good posture. When working at a computer, place your monitor at a height where you are looking straight ahead and you do not have to tilt your head forward or downward to look at the screen. Resting When lying down and resting, avoid positions that are most painful for you. If you have pain with activities such as sitting, bending, stooping, or squatting, lie in a position in which your body does not bend very much. For example, avoid curling up on your side with your arms and knees near your chest (fetal position). If you have pain with activities such as standing for a  long time or reaching with your arms, lie with your spine in a neutral position and bend your knees slightly. Try the following positions: Lying on your side with a pillow between your knees. Lying on your back with a pillow under your knees. Lifting  When lifting objects, keep your feet at least shoulder-width apart and tighten your abdominal muscles. Bend your knees and hips and keep your spine neutral. It is important to lift using the strength of your legs, not your back. Do not lock your knees straight out. Always ask for help to lift heavy or awkward objects. This information is not intended to replace advice given to you by your health care provider. Make sure you discuss any questions you have with your health care provider. Document Revised: 07/02/2020 Document Reviewed: 07/02/2020 Elsevier Patient Education  2022 [employer].

## 2021-03-06 NOTE — Telephone Encounter (Signed)
Per chart review tab pt has already had office appt with Dr cody. Nothng further needed at this time.

## 2021-03-06 NOTE — Telephone Encounter (Signed)
[facility] Night - Client TELEPHONE ADVICE RECORD [facility] Patient Name: Tanner Diaz Gender: Male DOB: December 27, 1980 Age: 40 Y 9 M 25 D Return Phone Number: [phone] (Primary), [phone] (Secondary) Address: [place]  [address] Client [facility] Night - Client Client Site [facility] [place] - Night Physician Raechel Ache - MD Contact Type Call Who Is Calling Patient / Member / Family / Caregiver Call Type Triage / Clinical Caller Name Tanner Diaz Relationship To Patient Spouse Return Phone Number [phone] (Primary) Chief Complaint Back Pain - General Reason for Call Symptomatic / Request for Health Information Initial Comment Caller states her husband is having back spasm and can not move Translation No Disp. Time Lamount Cohen Time) Disposition Final User 03/06/2021 9:11:59 AM Clinical Call Yes Dew, RN, Marylene Land Comments User: Gifford Shave, RN Date/Time Lamount Cohen Time): 03/06/2021 9:11:34 AM Caller declined triage stating that he's at the MD office waiting to be see

## 2021-03-06 NOTE — Progress Notes (Signed)
Subjective:     Tanner Diaz is a 40 y.o. male presenting for Back Pain (Lumbar X 1.5 weeks after lifting utility trailer. This morning, got worse, with a shooting pain down R leg, tightness across back and barely able to walk. )     Back Pain This is a new problem. The current episode started 1 to 4 weeks ago (did have pain initially at injury and then nagging x 1 week w/ bending). The pain is present in the lumbar spine. The pain radiates to the right thigh (right buttock). The symptoms are aggravated by bending (every position). Associated symptoms include leg pain. Pertinent negatives include no bladder incontinence, bowel incontinence, numbness, tingling or weakness. Risk factors: occasional back pain. He has tried heat, NSAIDs and ice (modifying positions) for the symptoms. The treatment provided moderate relief.   Had not been needing medication until today  Review of Systems  Gastrointestinal:  Negative for bowel incontinence.  Genitourinary:  Negative for bladder incontinence.  Musculoskeletal:  Positive for back pain.  Neurological:  Negative for tingling, weakness and numbness.    Social History   Tobacco Use  Smoking Status Never  Smokeless Tobacco Never        Objective:    BP Readings from Last 3 Encounters:  03/06/21 120/84  09/28/19 110/80  08/10/19 120/90   Wt Readings from Last 3 Encounters:  03/06/21 247 lb 2 oz (112.1 kg)  09/28/19 234 lb (106.1 kg)  08/10/19 236 lb 8 oz (107.3 kg)    BP 120/84   Pulse 78   Temp (!) 96.1 F (35.6 C) (Temporal)   Ht 5\' 10"  (1.778 m)   Wt 247 lb 2 oz (112.1 kg)   SpO2 97%   BMI 35.46 kg/m    Physical Exam Constitutional:      Appearance: Normal appearance. He is not ill-appearing or diaphoretic.  HENT:     Right Ear: External ear normal.     Left Ear: External ear normal.  Eyes:     General: No scleral icterus.    Extraocular Movements: Extraocular movements intact.     Conjunctiva/sclera:  Conjunctivae normal.  Cardiovascular:     Rate and Rhythm: Normal rate.  Pulmonary:     Effort: Pulmonary effort is normal.  Musculoskeletal:     Cervical back: Neck supple.     Comments: Back:  Inspection: no step off or abnormality Palpation: no spinous or paraspinous ttp along the Thoracic or lumbar spine ROM: Decreased flexion 2/2 to pain, normal extension, rotation, and lateral flexion w/o pain Strength: normal LE strength testing Straight leg raise - negative Reflexes 2+ b/l  Skin:    General: Skin is warm and dry.  Neurological:     Mental Status: He is alert. Mental status is at baseline.  Psychiatric:        Mood and Affect: Mood normal.          Assessment & Plan:   Problem List Items Addressed This Visit       Musculoskeletal and Integument   Strain of lumbar region - Primary    No red flags and no bony tenderness or signs of nerve compression. Discussed muscle strain and no need for imaging at this time. Works as so recommend early PT if not improving. Muscle relaxant for nighttime and NSAID for main treatment. Handout of exercises. He does training at his job and at this point feels he can work, but will follow-up for work note  if needed.       Relevant Medications   cyclobenzaprine (FLEXERIL) 5 MG tablet   meloxicam (MOBIC) 7.5 MG tablet     Return if symptoms worsen or fail to improve.  Lynnda Child, MD  This visit occurred during the SARS-CoV-2 public health emergency.  Safety protocols were in place, including screening questions prior to the visit, additional usage of staff PPE, and extensive cleaning of exam room while observing appropriate contact time as indicated for disinfecting solutions.

## 2021-03-06 NOTE — Assessment & Plan Note (Signed)
No red flags and no bony tenderness or signs of nerve compression. Discussed muscle strain and no need for imaging at this time. 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 Muscle relaxant for nighttime and NSAID for main treatment. Handout of exercises. He does training at his job and at this point feels he can work, but will follow-up for work note if needed.

## 2021-04-02 ENCOUNTER — Other Ambulatory Visit: Payer: Self-pay | Admitting: Family Medicine

## 2021-04-02 DIAGNOSIS — S39012A Strain of muscle, fascia and tendon of lower back, initial encounter: Secondary | ICD-10-CM

## 2021-11-02 ENCOUNTER — Ambulatory Visit
Admission: EM | Admit: 2021-11-02 | Discharge: 2021-11-02 | Disposition: A | Discharge status: Home / Self Care | Payer: BC Managed Care – PPO | Attending: Family Medicine | Admitting: Family Medicine

## 2021-11-02 ENCOUNTER — Encounter: Payer: Self-pay | Admitting: Emergency Medicine

## 2021-11-02 DIAGNOSIS — M5489 Other dorsalgia: Secondary | ICD-10-CM

## 2021-11-02 DIAGNOSIS — S39012A Strain of muscle, fascia and tendon of lower back, initial encounter: Secondary | ICD-10-CM

## 2021-11-02 DIAGNOSIS — M549 Dorsalgia, unspecified: Secondary | ICD-10-CM | POA: Diagnosis not present

## 2021-11-02 DIAGNOSIS — M542 Cervicalgia: Secondary | ICD-10-CM | POA: Diagnosis not present

## 2021-11-02 MED ORDER — DICLOFENAC SODIUM 50 MG PO TBEC: 50.0000 mg | DELAYED_RELEASE_TABLET | Freq: Two times a day (BID) | ORAL | 0 refills | Status: DC | PRN

## 2021-11-02 MED ORDER — CYCLOBENZAPRINE HCL 5 MG PO TABS: 5.0000 mg | ORAL_TABLET | Freq: Two times a day (BID) | ORAL | 0 refills | Status: DC | PRN
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
## 2021-11-02 NOTE — Discharge Instructions (Signed)
Voltaren twice daily for the next 5 days regardless of symptoms.  Cyclobenzaprine which is a muscle relaxer you can take up to twice daily as needed.  Apply heat to neck and back as needed.  If symptoms persist after 5 days follow-up with [facility]

## 2021-11-08 ENCOUNTER — Ambulatory Visit (INDEPENDENT_AMBULATORY_CARE_PROVIDER_SITE_OTHER)
Admission: RE | Admit: 2021-11-08 | Discharge: 2021-11-08 | Disposition: A | Discharge status: Home / Self Care | Payer: BC Managed Care – PPO | Source: Ambulatory Visit | Attending: Nurse Practitioner | Admitting: Nurse Practitioner

## 2021-11-08 ENCOUNTER — Ambulatory Visit: Payer: BC Managed Care – PPO | Admitting: Nurse Practitioner

## 2021-11-08 DIAGNOSIS — M546 Pain in thoracic spine: Secondary | ICD-10-CM

## 2021-11-08 DIAGNOSIS — M542 Cervicalgia: Secondary | ICD-10-CM

## 2021-11-08 DIAGNOSIS — M5459 Other low back pain: Secondary | ICD-10-CM
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
## 2021-11-08 NOTE — Assessment & Plan Note (Signed)
Patient is complaining of neck pain and thoracic pain.  He was seen at urgent care and imaging performed was given diclofenac and a muscle relaxer.  Has been taking diclofenac and a muscle relaxer at least at night without great relief states is actually increased in severity.  We will obtain cervical spine x-ray and thoracic spine x-ray do feel like this is muscular in nature.  Did give recommendations in regards to over-the-counter treatments along with the prescribed treatments already.  No red flags on exam in office today.  Follow-up if no improvement patient would be interested in physical therapy if x-rays look okay

## 2021-11-08 NOTE — Patient Instructions (Signed)
Nice to see you today I will be in touch with the xrays once I have the results Continue taking the flexeril and diclofenac tablets You can take tylenol also if needed and over the counter lidocaine patches Heat and Ice are ok to use also

## 2021-11-13 ENCOUNTER — Telehealth: Payer: Self-pay | Admitting: Family Medicine

## 2021-11-13 NOTE — Telephone Encounter (Signed)
See xray result note-spoke with patient.

## 2021-11-13 NOTE — Telephone Encounter (Signed)
Pt called in stated he was returning a call unable to locate phone notes

## 2021-11-15 ENCOUNTER — Telehealth: Payer: Self-pay | Admitting: Nurse Practitioner

## 2021-11-15 ENCOUNTER — Other Ambulatory Visit: Payer: Self-pay | Admitting: Nurse Practitioner

## 2021-11-15 DIAGNOSIS — M542 Cervicalgia: Secondary | ICD-10-CM

## 2021-11-15 NOTE — Telephone Encounter (Signed)
-----   Message from [facility], [place] sent at 11/13/2021  4:11 PM EDT ----- Patient advised. He would like to proceed with PT, order to be sent to [facility] (he sees them for ankle issues too so he is established patient)-[facility] [address] [place].

## 2021-11-15 NOTE — Telephone Encounter (Signed)
Referral placed.

## 2021-12-08 ENCOUNTER — Other Ambulatory Visit: Payer: Self-pay | Admitting: Family Medicine

## 2021-12-08 DIAGNOSIS — Z8639 Personal history of other endocrine, nutritional and metabolic disease: Secondary | ICD-10-CM

## 2021-12-16 ENCOUNTER — Other Ambulatory Visit (INDEPENDENT_AMBULATORY_CARE_PROVIDER_SITE_OTHER): Payer: BC Managed Care – PPO

## 2021-12-16 DIAGNOSIS — Z8639 Personal history of other endocrine, nutritional and metabolic disease: Secondary | ICD-10-CM

## 2021-12-16 LAB — LIPID PANEL
Cholesterol: 170 mg/dL (ref 0–200)
HDL: 42.7 mg/dL (ref >39.00)
LDL Cholesterol: 107 mg/dL — ABNORMAL HIGH (ref 0–99)
NonHDL: 127.15
Total CHOL/HDL Ratio: 4
Triglycerides: 100 mg/dL (ref 0.0–149.0)
VLDL: 20 mg/dL (ref 0.0–40.0)

## 2021-12-16 LAB — COMPREHENSIVE METABOLIC PANEL
ALT: 21 U/L (ref 0–53)
AST: 19 U/L (ref 0–37)
Albumin: 4.4 g/dL (ref 3.5–5.2)
Alkaline Phosphatase: 28 U/L — ABNORMAL LOW (ref 39–117)
BUN: 8 mg/dL (ref 6–23)
CO2: 28 mEq/L (ref 19–32)
Calcium: 9.5 mg/dL (ref 8.4–10.5)
Chloride: 104 mEq/L (ref 96–112)
Creatinine, Ser: 1.31 mg/dL (ref 0.40–1.50)
GFR: 66.63 mL/min (ref >60.00)
Glucose, Bld: 95 mg/dL (ref 70–99)
Potassium: 3.7 mEq/L (ref 3.5–5.1)
Sodium: 140 mEq/L (ref 135–145)
Total Bilirubin: 0.6 mg/dL (ref 0.2–1.2)
Total Protein: 6.9 g/dL (ref 6.0–8.3)

## 2021-12-20 ENCOUNTER — Ambulatory Visit (INDEPENDENT_AMBULATORY_CARE_PROVIDER_SITE_OTHER): Payer: BC Managed Care – PPO | Admitting: Family Medicine

## 2021-12-20 ENCOUNTER — Encounter: Payer: Self-pay | Admitting: Family Medicine

## 2021-12-20 VITALS — BP 122/80 | HR 83 | Temp 97.6°F | Ht 70.0 in | Wt 240.0 lb

## 2021-12-20 DIAGNOSIS — Z0000 Encounter for general adult medical examination without abnormal findings: Secondary | ICD-10-CM | POA: Diagnosis not present

## 2021-12-20 DIAGNOSIS — Z7189 Other specified counseling: Secondary | ICD-10-CM

## 2021-12-20 DIAGNOSIS — K219 Gastro-esophageal reflux disease without esophagitis: Secondary | ICD-10-CM

## 2021-12-20 DIAGNOSIS — M542 Cervicalgia: Secondary | ICD-10-CM

## 2021-12-20 MED ORDER — PANTOPRAZOLE SODIUM 20 MG PO TBEC: 20.0000 mg | DELAYED_RELEASE_TABLET | Freq: Every day | ORAL | 1 refills | Status: DC

## 2021-12-20 NOTE — Patient Instructions (Signed)
Keep going with PT.   Update me as needed.  Take care.  Glad to see you. Taper protonix.
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
## 2021-12-22 NOTE — Assessment & Plan Note (Signed)
Tetanus 2021- done out of clinic.  Flu d/w pt. encouraged. PNA and shingles not due  Covid vaccine prev done.   Prostate cancer screening and PSA options (with potential risks and benefits of testing vs not testing) were discussed along with recent recs/guidelines.  PSA not indicated.  Colon cancer screening not due.  Living will d/w pt.  Wife designated if patient were incapacitated.  Diet and exercise d/w pt.   HIV screening done prev about 2010.  Labs d/w pt.

## 2021-12-22 NOTE — Assessment & Plan Note (Signed)
Living will d/w pt.  Wife designated if patient were incapacitated.   ?

## 2021-12-22 NOTE — Assessment & Plan Note (Signed)
ER eval for chest sx.   CBC nonactionable CMP nonactionable Lipase negative Trop negative, do not feel he needs repeat trop given onset of sxs 5 days ago EKG with NSR, no ischemic changes CXR with no focal pneumonia  Rx'd carafate and protonix.  He couldn't get carafate filled.  He is on protonix.  No issues in the last week.    Discussed tapering Protonix and he is going to follow-up with cardiology for stress test on Monday just to make sure that is negative/reassuring.  I will await outside notes.

## 2021-12-22 NOTE — Assessment & Plan Note (Signed)
Still with muscle pain but overall benign exam and I think it makes sense to follow-up with PT.  Anatomy discussed with patient.

## 2021-12-23 DIAGNOSIS — R002 Palpitations: Secondary | ICD-10-CM | POA: Insufficient documentation

## 2022-01-18 ENCOUNTER — Other Ambulatory Visit: Payer: Self-pay | Admitting: Family Medicine

## 2022-02-03 ENCOUNTER — Other Ambulatory Visit: Payer: Self-pay | Admitting: Family Medicine

## 2022-03-13 ENCOUNTER — Encounter: Payer: Self-pay | Admitting: Internal Medicine

## 2022-03-13 ENCOUNTER — Ambulatory Visit: Payer: BC Managed Care – PPO | Admitting: Nurse Practitioner

## 2022-03-13 ENCOUNTER — Ambulatory Visit: Payer: BC Managed Care – PPO | Admitting: Internal Medicine

## 2022-03-13 VITALS — BP 112/68 | HR 84 | Temp 97.7°F | Ht 70.0 in | Wt 246.0 lb

## 2022-03-13 DIAGNOSIS — J019 Acute sinusitis, unspecified: Secondary | ICD-10-CM | POA: Insufficient documentation

## 2022-03-13 MED ORDER — BENZONATATE 200 MG PO CAPS: 200.0000 mg | ORAL_CAPSULE | Freq: Three times a day (TID) | ORAL | 0 refills | Status: DC | PRN

## 2022-03-13 MED ORDER — AMOXICILLIN 500 MG PO TABS: 1000.0000 mg | ORAL_TABLET | Freq: Two times a day (BID) | ORAL | 0 refills | Status: AC

## 2022-03-13 NOTE — Progress Notes (Signed)
Subjective:    Patient ID: Tanner Diaz, male    DOB: July 07, 1980, 41 y.o.   MRN: [id number]  HPI Here due to respiratory infection  Has had a nagging cough for 2 weeks Dry at first Slight throat tingling Now for 3-4 days--sinuses draining Coughing out green sputum in morning--yellow other times Cough worse Post nasal drip  No headache  No ear pain Not prone to sinus problems Some SOB just with coughing spells  Tried Robitussin and dayquil sinus (that cleared him some)  Current Outpatient Medications on File Prior to Visit  Medication Sig Dispense Refill   Probiotic Product (PROBIOTIC PO) Take 1 tablet by mouth daily.     cyclobenzaprine (FLEXERIL) 5 MG tablet Take 1 tablet (5 mg total) by mouth 2 (two) times daily as needed for muscle spasms. (Patient not taking: Reported on 03/13/2022) 30 tablet 0   pantoprazole (PROTONIX) 20 MG tablet TAKE 1 TABLET BY MOUTH EVERY DAY (Patient not taking: Reported on 03/13/2022) 90 tablet 1   No current facility-administered medications on file prior to visit.    Allergies  Allergen Reactions   Milk-Related Compounds Other (See Comments)    GI upset    Past Medical History:  Diagnosis Date   GERD (gastroesophageal reflux disease)     History reviewed. No pertinent surgical history.  Family History  Problem Relation Age of Onset   Stroke Father    Diabetes Father    Diabetes Other    Colon cancer Neg Hx    Prostate cancer Neg Hx     Social History   Socioeconomic History   Marital status: Married    Spouse name: Not on file   Number of children: Not on file   Years of education: Not on file   Highest education level: Not on file  Occupational History   Occupation: Civil engineer, contracting: [employer]  Tobacco Use   Smoking status: Never    Passive exposure: Past   Smokeless tobacco: Never  Vaping Use   Vaping Use: Never used  Substance and Sexual Activity   Alcohol use: No    Alcohol/week: 0.0  standard drinks of alcohol   Drug use: No   Sexual activity: Not on file  Other Topics Concern   Not on file  Social History Narrative   [employer]   [employer] fan   Married 2004   4 daughters   Likes to Hydrologist   Social Determinants of Corporate investment banker Strain: Not on file  Food Insecurity: Not on file  Transportation Needs: Not on file  Physical Activity: Not on file  Stress: Not on file  Social Connections: Not on file  Intimate Partner Violence: Not on file   Review of Systems No N/V Some upset stomach---had to change his probiotic Eating okay     Objective:   Physical Exam Constitutional:      Appearance: Normal appearance.  HENT:     Right Ear: Tympanic membrane and ear canal normal.     Left Ear: Ear canal normal.     Ears:     Comments: Slight redness in left TM    Nose: No congestion.     Mouth/Throat:     Pharynx: Posterior oropharyngeal erythema present. No oropharyngeal exudate.  Pulmonary:     Effort: Pulmonary effort is normal.     Breath sounds: Normal breath sounds. No wheezing or rales.  Musculoskeletal:     Cervical back:  Neck supple.  Lymphadenopathy:     Cervical: No cervical adenopathy.  Neurological:     Mental Status: He is alert.            Assessment & Plan:

## 2022-03-13 NOTE — Assessment & Plan Note (Signed)
2 weeks of apparent viral symptoms--then worsening Discussed analgescs, OTC cough meds Amoxil 1000 bid x 1 week and benzonatate

## 2022-03-25 ENCOUNTER — Telehealth: Payer: Self-pay | Admitting: Family Medicine

## 2022-03-25 MED ORDER — HYDROCODONE BIT-HOMATROP MBR 5-1.5 MG/5ML PO SOLN: 5.0000 mL | Freq: Every evening | ORAL | 0 refills | Status: DC | PRN

## 2022-03-25 NOTE — Telephone Encounter (Signed)
Spoke to pt

## 2022-03-25 NOTE — Telephone Encounter (Signed)
Let him know I sent a narcotic cough syrup that is really just for bedtime

## 2022-03-25 NOTE — Telephone Encounter (Signed)
Pt called stating he seen [facility] on 03/13/22 z7 was told if the meds, benzonatate (TESSALON) 200 MG capsule, didn't work, let [facility]ak know & something stronger would be prescribed. Pt is asking can something be sent in? Preferred pharmacy is [facility] [phone] - [place], [place]. Call back # [phone]

## 2022-03-27 MED ORDER — AMOXICILLIN-POT CLAVULANATE 875-125 MG PO TABS: 1.0000 | ORAL_TABLET | Freq: Two times a day (BID) | ORAL | 0 refills | Status: DC

## 2022-03-27 NOTE — Telephone Encounter (Signed)
Please let him know I sent a stronger antibiotic

## 2022-03-27 NOTE — Telephone Encounter (Signed)
Pt called asking can another antibiotic be prescribed? Pt states he isn't feeling better & he had some relief while taking the antibiotic but now that its gone, his symptoms have returned. Call back # [phone]

## 2022-03-27 NOTE — Addendum Note (Signed)
Addended by: Tillman Abide I on: 03/27/2022 01:23 PM   Modules accepted: Orders

## 2022-03-27 NOTE — Telephone Encounter (Signed)
Tried to call pt. VM is full so I could not leave a message. Sent a [employer] geologist.

## 2022-04-29 ENCOUNTER — Telehealth: Payer: Self-pay

## 2022-04-29 NOTE — Telephone Encounter (Signed)
I spoke with pt; for last 3 days pt has had rt upper back pain that goes toward shoulder, No CP,SOB,H/A or dizziness. No known injury. Offered pt several appts but did not meet needs of pt. Pt scheduled visit at [facility] on 04/30/21 at 9:45 with UC & ED precautions and pt voiced understanding.  Sending note to Dr Damita Dunnings and Damita Dunnings pool.

## 2022-04-29 NOTE — Telephone Encounter (Signed)
Noted. Thanks.
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
## 2022-04-30 ENCOUNTER — Ambulatory Visit
Admission: RE | Admit: 2022-04-30 | Discharge: 2022-04-30 | Disposition: A | Discharge status: Home / Self Care | Payer: BC Managed Care – PPO | Source: Ambulatory Visit | Attending: Emergency Medicine | Admitting: Emergency Medicine

## 2022-04-30 VITALS — BP 141/91 | HR 94 | Temp 98.5°F | Resp 18 | Ht 70.0 in | Wt 235.0 lb

## 2022-04-30 DIAGNOSIS — M549 Dorsalgia, unspecified: Secondary | ICD-10-CM | POA: Diagnosis not present

## 2022-04-30 DIAGNOSIS — M25511 Pain in right shoulder: Secondary | ICD-10-CM | POA: Diagnosis not present

## 2022-04-30 MED ORDER — CYCLOBENZAPRINE HCL 10 MG PO TABS: 10.0000 mg | ORAL_TABLET | Freq: Two times a day (BID) | ORAL | 0 refills | Status: DC | PRN
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
## 2022-04-30 NOTE — Discharge Instructions (Addendum)
Take ibuprofen or Tylenol as needed for discomfort.  Take the muscle relaxer as needed for muscle spasm; Do not drive, operate machinery, or drink alcohol with this medication as it can cause drowsiness.   Follow up with your primary care provider or an orthopedist if your symptoms are not improving.     

## 2022-12-03 ENCOUNTER — Telehealth: Payer: Self-pay | Admitting: Family Medicine

## 2022-12-03 DIAGNOSIS — K130 Diseases of lips: Secondary | ICD-10-CM

## 2022-12-03 NOTE — Telephone Encounter (Signed)
Please see about scheduling a visit.  I put in the referral in the meantime but it is hard to get a dermatology appointment.  There are likely options he could use in the meantime after checking the affected area.  I would stop using the chap stick in the meantime.  Thanks.
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
## 2022-12-03 NOTE — Addendum Note (Signed)
Addended by: Joaquim Nam on: 12/03/2022 04:38 PM   Modules accepted: Orders

## 2022-12-04 NOTE — Telephone Encounter (Signed)
Lvmtcb, sent mychart message  

## 2022-12-05 ENCOUNTER — Ambulatory Visit: Payer: BC Managed Care – PPO | Admitting: Family Medicine

## 2022-12-05 ENCOUNTER — Encounter: Payer: Self-pay | Admitting: Family Medicine

## 2022-12-05 VITALS — BP 140/90 | HR 76 | Temp 97.9°F | Ht 70.0 in | Wt 247.0 lb

## 2022-12-05 DIAGNOSIS — R220 Localized swelling, mass and lump, head: Secondary | ICD-10-CM | POA: Diagnosis not present

## 2022-12-05 DIAGNOSIS — B360 Pityriasis versicolor: Secondary | ICD-10-CM

## 2022-12-05 MED ORDER — LORATADINE 10 MG PO TABS: 10.0000 mg | ORAL_TABLET | Freq: Every day | ORAL | Status: AC

## 2022-12-05 MED ORDER — LORATADINE 10 MG PO TABS: 10.0000 mg | ORAL_TABLET | Freq: Every day | ORAL | Status: DC | PRN

## 2022-12-05 MED ORDER — EPINEPHRINE 0.3 MG/0.3ML IJ SOAJ: 0.3000 mg | INTRAMUSCULAR | 1 refills | Status: DC | PRN

## 2022-12-05 NOTE — Progress Notes (Unsigned)
Upper lip symptoms.  Had upper lip swelling last week, not lower lip.  Then swelling resolved but had flaking skin then residual upper lip discoloration.    No ACE or ARB use.  No lip tingling.  No tongue changes.  No wheeze.  No dysphagia.  No itching or rash.    3rd episode lifetime, all the upper lip.  Lip discoloration happened after 2nd episode and more after 3rd episode.  1st episode after drinking a monster energy drink.  No other clear trigger for the other two events.    He had used carmex prior to the 3rd event. He doesn't usually use carmex.  He had used pantoprazole last week but he had used that in the past w/o troubles.  No recent tick bites.    He also has possible tinea versicolor on his back and a small area of hyperpigmentation that could be from a small cyst on the left upper chest wall.  Meds, vitals, and allergies reviewed.   ROS: Per HPI unless specifically indicated in ROS section   Nad Ncat No lip swelling but he does have residual hyperpigmentation on the bilateral upper lip.  Tongue is normal. Neck without stridor. Rrr Ctab, no wheeze. He has a diffuse macular rash on his back that appears to have varying levels of pigmentation that could be from tinea versicolor.   Small cyst L upper chest wall.  Does not appear inflamed.

## 2022-12-05 NOTE — Patient Instructions (Addendum)
Let me know if you don't get a call about seeing the allergy clinic.  Take claritin 10mg  a day for now.  Keep epi pen on hand.  If used, then dial 911.   I wouldn't use carmex or drink a monster energy drink.  Take care.  Glad to see you.

## 2022-12-07 DIAGNOSIS — R220 Localized swelling, mass and lump, head: Secondary | ICD-10-CM | POA: Insufficient documentation

## 2022-12-07 NOTE — Assessment & Plan Note (Signed)
Likely, reasonable to talk to dermatology about this and the cyst on his chest wall.  I think it makes sense not to change his medications otherwise until we determine the issue with his lip swelling.  He agreed.

## 2022-12-07 NOTE — Assessment & Plan Note (Signed)
I suspect that the postinflammatory hyperpigmentation and the flaking skin were related to the primary issue that caused the lip swelling.  The issue is determining what caused the lip swelling.  Discussed allergy/angioedema causes.  He does not have a typical meat allergy.  No stridor or wheeze.  He is not on ACE inhibitor.  Routine cautions given to patient.  Refer to allergy clinic. Take claritin 10mg  a day for now.  Keep epi pen on hand.  If used, then dial 911.   I wouldn't use carmex or drink a monster energy drink.  Update [place] as needed.  He agrees to plan.

## 2022-12-08 ENCOUNTER — Encounter: Payer: Self-pay | Admitting: *Deleted

## 2023-03-16 ENCOUNTER — Telehealth: Payer: Self-pay

## 2023-03-16 NOTE — Telephone Encounter (Signed)
I spoke with pt; pt was seen 03/14/23 at [facility] ED and dx with dehydration; pt has been drinking more water and is feeling a lot better. Pt scheduled ED FU 03/19/23 at 10:30 with Dr Kerry [facility] & ED precautions and pt voiced understanding. Sending note to Dr Para March.

## 2023-03-17 NOTE — Telephone Encounter (Signed)
Noted. Thanks. Please request the ER report.

## 2023-03-18 NOTE — Telephone Encounter (Signed)
Noted.  Thanks.  Please check with patient and if they gave him any discharge paperwork or results, please have him bring those to the visit in case we cannot get the records in the meantime.

## 2023-03-18 NOTE — Telephone Encounter (Signed)
Advised patient to bring any discharge paperwork or results to his appointment.

## 2023-03-18 NOTE — Telephone Encounter (Signed)
[facility] and was connected to medical records. Left message to see about obtaining records.

## 2023-03-19 ENCOUNTER — Encounter: Payer: Self-pay | Admitting: Family Medicine

## 2023-03-19 ENCOUNTER — Ambulatory Visit: Payer: BC Managed Care – PPO | Admitting: Family Medicine

## 2023-03-19 VITALS — BP 128/84 | HR 81 | Temp 98.6°F | Ht 70.0 in | Wt 240.0 lb

## 2023-03-19 DIAGNOSIS — E860 Dehydration: Secondary | ICD-10-CM

## 2023-03-19 DIAGNOSIS — R0683 Snoring: Secondary | ICD-10-CM

## 2023-03-19 NOTE — Patient Instructions (Addendum)
Episode likely related to relative dehydration.   Goal to normalize sleep and regulate food intake.   Drink enough water to keep you urine clear and cut out the energy drinks.  You should get a call about seeing pulmonary.  Take care.  Glad to see you.
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
## 2023-03-22 DIAGNOSIS — E860 Dehydration: Secondary | ICD-10-CM | POA: Insufficient documentation

## 2023-03-22 DIAGNOSIS — R0683 Snoring: Secondary | ICD-10-CM | POA: Insufficient documentation

## 2023-03-22 NOTE — Assessment & Plan Note (Signed)
Benign exam.  Episode likely related to relative dehydration.   Goal to normalize sleep and regulate food intake.   Drink enough water to keep you urine clear and cut out the energy drinks.  Update me as needed.  He agrees.

## 2023-03-22 NOTE — Assessment & Plan Note (Signed)
Refer for OSA eval, rationale d/w pt.  He agrees.

## 2023-03-25 ENCOUNTER — Encounter: Payer: Self-pay | Admitting: Internal Medicine

## 2023-05-24 ENCOUNTER — Telehealth: Payer: 59 | Admitting: Family

## 2023-05-24 DIAGNOSIS — R6889 Other general symptoms and signs: Secondary | ICD-10-CM

## 2023-05-24 MED ORDER — PROMETHAZINE-DM 6.25-15 MG/5ML PO SYRP: 5.0000 mL | ORAL_SOLUTION | Freq: Three times a day (TID) | ORAL | 0 refills | Status: DC | PRN

## 2023-05-24 MED ORDER — BENZONATATE 200 MG PO CAPS: 200.0000 mg | ORAL_CAPSULE | Freq: Two times a day (BID) | ORAL | 0 refills | Status: DC | PRN

## 2023-05-24 MED ORDER — OSELTAMIVIR PHOSPHATE 75 MG PO CAPS: 75.0000 mg | ORAL_CAPSULE | Freq: Two times a day (BID) | ORAL | 0 refills | Status: DC

## 2023-05-24 NOTE — Progress Notes (Signed)
Virtual Visit Consent   Tanner Diaz, you are scheduled for a virtual visit with a [facility] provider today. Just as with appointments in the office, your consent must be obtained to participate. Your consent will be active for this visit and any virtual visit you may have with one of our providers in the next 365 days. If you have a MyChart account, a copy of this consent can be sent to you electronically.  As this is a virtual visit, video technology does not allow for your provider to perform a traditional examination. This may limit your provider's ability to fully assess your condition. If your provider identifies any concerns that need to be evaluated in person or the need to arrange testing (such as labs, EKG, etc.), we will make arrangements to do so. Although advances in technology are sophisticated, we cannot ensure that it will always work on either your end or our end. If the connection with a video visit is poor, the visit may have to be switched to a telephone visit. With either a video or telephone visit, we are not always able to ensure that we have a secure connection.  By engaging in this virtual visit, you consent to the provision of healthcare and authorize for your insurance to be billed (if applicable) for the services provided during this visit. Depending on your insurance coverage, you may receive a charge related to this service.  I need to obtain your verbal consent now. Are you willing to proceed with your visit today? CHANDLOR NOECKER has provided verbal consent on 05/24/2023 for a virtual visit (video or telephone). Jannifer Rodney, FNP  Date: 05/24/2023 2:26 PM  Virtual Visit via Video Note   I, Jannifer Rodney, connected with  OLUWAFEMI VILLELLA  ([id number], 02-05-1981) on 05/24/23 at  2:30 PM EST by a video-enabled telemedicine application and verified that I am speaking with the correct person using two identifiers.  Location: Patient: Virtual Visit Location Patient:  Home Provider: Virtual Visit Location Provider: Home Office   I discussed the limitations of evaluation and management by telemedicine and the availability of in person appointments. The patient expressed understanding and agreed to proceed.    History of Present Illness: Tanner Diaz is a 43 y.o. who identifies as a male who was assigned male at birth, and is being seen today for fever, sore throat, cough,  and body aches that started yesterday.   HPI: URI  This is a new problem. The current episode started yesterday. The problem has been gradually worsening. The maximum temperature recorded prior to his arrival was 102 - 102.9 F. Associated symptoms include congestion, coughing, headaches, joint pain, rhinorrhea, sinus pain and a sore throat. Pertinent negatives include no ear pain, nausea, sneezing, swollen glands, vomiting or wheezing. He has tried acetaminophen, decongestant and increased fluids for the symptoms. The treatment provided mild relief.    Problems:  Patient Active Problem List   Diagnosis Date Noted   Dehydration 03/22/2023   Snoring 03/22/2023   Lip swelling 12/07/2022   Palpitations 12/23/2021   Strain of lumbar region 03/06/2021   Neck pain 09/22/2018   Foot pain 09/15/2017   Advance care planning 09/15/2014   Tinea versicolor 09/15/2014   Back pain 09/15/2014   Routine general medical examination at a health care facility 12/31/2011   GERD (gastroesophageal reflux disease) 03/26/2011    Allergies:  Allergies  Allergen Reactions   Milk-Related Compounds Other (See Comments)    GI upset  Medications:  Current Outpatient Medications:    benzonatate (TESSALON) 200 MG capsule, Take 1 capsule (200 mg total) by mouth 2 (two) times daily as needed for cough., Disp: 20 capsule, Rfl: 0   oseltamivir (TAMIFLU) 75 MG capsule, Take 1 capsule (75 mg total) by mouth 2 (two) times daily., Disp: 10 capsule, Rfl: 0   promethazine-dextromethorphan (PROMETHAZINE-DM) 6.25-15  MG/5ML syrup, Take 5 mLs by mouth 3 (three) times daily as needed for cough., Disp: 118 mL, Rfl: 0   cyclobenzaprine (FLEXERIL) 10 MG tablet, Take 1 tablet (10 mg total) by mouth 2 (two) times daily as needed for muscle spasms., Disp: 20 tablet, Rfl: 0   EPINEPHrine 0.3 mg/0.3 mL IJ SOAJ injection, Inject 0.3 mg into the muscle as needed for anaphylaxis., Disp: 2 each, Rfl: 1   loratadine (CLARITIN) 10 MG tablet, Take 1 tablet (10 mg total) by mouth daily., Disp: , Rfl:    Probiotic Product (PROBIOTIC PO), Take 1 tablet by mouth daily., Disp: , Rfl:   Observations/Objective: Patient is well-developed, well-nourished in no acute distress.  Resting comfortably  at home.  Head is normocephalic, atraumatic.  No labored breathing.  Speech is clear and coherent with logical content.  Patient is alert and oriented at baseline.    Assessment and Plan: 1. Flu-like symptoms (Primary) - oseltamivir (TAMIFLU) 75 MG capsule; Take 1 capsule (75 mg total) by mouth 2 (two) times daily.  Dispense: 10 capsule; Refill: 0 - benzonatate (TESSALON) 200 MG capsule; Take 1 capsule (200 mg total) by mouth 2 (two) times daily as needed for cough.  Dispense: 20 capsule; Refill: 0 - promethazine-dextromethorphan (PROMETHAZINE-DM) 6.25-15 MG/5ML syrup; Take 5 mLs by mouth 3 (three) times daily as needed for cough.  Dispense: 118 mL; Refill: 0  Force fluids Rest Tylenol as needed Droplet precautions discussed  Follow up if symptoms worsen or do not improve   Follow Up Instructions: I discussed the assessment and treatment plan with the patient. The patient was provided an opportunity to ask questions and all were answered. The patient agreed with the plan and demonstrated an understanding of the instructions.  A copy of instructions were sent to the patient via MyChart unless otherwise noted below.     The patient was advised to call back or seek an in-person evaluation if the symptoms worsen or if the condition  fails to improve as anticipated.    Jannifer Rodney, FNP

## 2023-06-01 ENCOUNTER — Institutional Professional Consult (permissible substitution): Payer: 59 | Admitting: Internal Medicine

## 2023-06-01 NOTE — Progress Notes (Deleted)
 Name: Tanner Diaz MRN: [id number] DOB: July 28, 1980    CHIEF COMPLAINT:  EXCESSIVE DAYTIME SLEEPINESS   HISTORY OF PRESENT ILLNESS: Patient is seen today for problems and issues with sleep related to excessive daytime sleepiness Patient  has been having sleep problems for many years Patient has been having excessive daytime sleepiness for a long time Patient has been having extreme fatigue and tiredness, lack of energy +  very Loud snoring every night + struggling breathe at night and gasps for air + morning headaches + Nonrefreshing sleep  Discussed sleep data and reviewed with patient.  Encouraged proper weight management.  Discussed driving precautions and its relationship with hypersomnolence.  Discussed operating dangerous equipment and its relationship with hypersomnolence.  Discussed sleep hygiene, and benefits of a fixed sleep waked time.  The importance of getting eight or more hours of sleep discussed with patient.  Discussed limiting the use of the computer and television before bedtime.  Decrease naps during the day, so night time sleep will become enhanced.  Limit caffeine, and sleep deprivation.  HTN, stroke, and heart failure are potential risk factors.    EPWORTH SLEEP SCORE***    PAST MEDICAL HISTORY :   has a past medical history of GERD (gastroesophageal reflux disease).  has no past surgical history on file. Prior to Admission medications   Medication Sig Start Date End Date Taking? Authorizing Provider  benzonatate (TESSALON) 200 MG capsule Take 1 capsule (200 mg total) by mouth 2 (two) times daily as needed for cough. 05/24/23   Junie Spencer, FNP  cyclobenzaprine (FLEXERIL) 10 MG tablet Take 1 tablet (10 mg total) by mouth 2 (two) times daily as needed for muscle spasms. 04/30/22   Mickie Bail, NP  EPINEPHrine 0.3 mg/0.3 mL IJ SOAJ injection Inject 0.3 mg into the muscle as needed for anaphylaxis. 12/05/22   Joaquim Nam, MD  loratadine  (CLARITIN) 10 MG tablet Take 1 tablet (10 mg total) by mouth daily. 12/05/22   Joaquim Nam, MD  oseltamivir (TAMIFLU) 75 MG capsule Take 1 capsule (75 mg total) by mouth 2 (two) times daily. 05/24/23   Junie Spencer, FNP  Probiotic Product (PROBIOTIC PO) Take 1 tablet by mouth daily.    [provider]  promethazine-dextromethorphan (PROMETHAZINE-DM) 6.25-15 MG/5ML syrup Take 5 mLs by mouth 3 (three) times daily as needed for cough. 05/24/23   Junie Spencer, FNP   Allergies  Allergen Reactions   Milk-Related Compounds Other (See Comments)    GI upset    FAMILY HISTORY:  family history includes Diabetes in his father and another family member; Stroke in his father. SOCIAL HISTORY:  reports that he has never smoked. He has been exposed to tobacco smoke. He has never used smokeless tobacco. He reports that he does not drink alcohol and does not use drugs.   Review of Systems:  Gen:  Denies  fever, sweats, chills weight loss  HEENT: Denies blurred vision, double vision, ear pain, eye pain, hearing loss, nose bleeds, sore throat Cardiac:  No dizziness, chest pain or heaviness, chest tightness,edema, No JVD Resp:   No cough, -sputum production, -shortness of breath,-wheezing, -hemoptysis,  Gi: Denies swallowing difficulty, stomach pain, nausea or vomiting, diarrhea, constipation, bowel incontinence Gu:  Denies bladder incontinence, burning urine Ext:   Denies Joint pain, stiffness or swelling Skin: Denies  skin rash, easy bruising or bleeding or hives Endoc:  Denies polyuria, polydipsia , polyphagia or weight change Psych:   Denies depression,  insomnia or hallucinations  Other:  All other systems negative   ALL OTHER ROS ARE NEGATIVE    VITAL SIGNS: @VSRANGES @   Physical Examination:   General Appearance: No distress  EYES PERRLA, EOM intact.   NECK Supple, No JVD ORAL CAVITY MALLAMPATI 4 Pulmonary: normal breath sounds, No wheezing.  CardiovascularNormal  S1,S2.  No m/r/g.   Abdomen: Benign, Soft, non-tender. Skin:   warm, no rashes, no ecchymosis  Extremities: normal, no cyanosis, clubbing. Neuro:without focal findings,  speech normal  PSYCHIATRIC: Mood, affect within normal limits.   ALL OTHER ROS ARE NEGATIVE    ASSESSMENT AND PLAN SYNOPSIS  Patient with signs and symptoms of excessive daytime sleepiness with probable underlying diagnosis of obstructive sleep apnea in the setting of obesity and deconditioned state   Recommend Sleep Study for definitve diagnosis  Obesity -recommend significant weight loss -recommend changing diet  Deconditioned state -Recommend increased daily activity and exercise   MEDICATION ADJUSTMENTS/LABS AND TESTS ORDERED: Recommend Sleep Study Recommend weight loss   CURRENT MEDICATIONS REVIEWED AT LENGTH WITH PATIENT TODAY   Patient  satisfied with Plan of action and management. All questions answered  Follow up  3 months   I spent a total of  *** minutes reviewing chart data, face-to-face evaluation with the patient, counseling and coordination of care as detailed above.    Lucie Leather, M.D.  [facility]  Medical Director [facility] Medical Director [facility] Cardio-Pulmonary Department

## 2023-10-02 ENCOUNTER — Ambulatory Visit: Admitting: Family Medicine

## 2023-10-02 ENCOUNTER — Encounter: Payer: Self-pay | Admitting: *Deleted

## 2023-10-02 ENCOUNTER — Encounter: Payer: Self-pay | Admitting: Family Medicine

## 2023-10-02 ENCOUNTER — Ambulatory Visit: Payer: Self-pay

## 2023-10-02 VITALS — BP 132/84 | HR 67 | Temp 99.0°F | Ht 70.0 in | Wt 246.4 lb

## 2023-10-02 DIAGNOSIS — M79603 Pain in arm, unspecified: Secondary | ICD-10-CM

## 2023-10-02 MED ORDER — MELOXICAM 15 MG PO TABS: 7.5000 mg | ORAL_TABLET | Freq: Every day | ORAL | 1 refills | Status: DC

## 2023-10-02 NOTE — Telephone Encounter (Signed)
 FYI Only or Action Required?: FYI only for provider  Patient was last seen in primary care on 05/24/2023 by Yevette Hem, FNP. Called Nurse Triage reporting No chief complaint on file.. Symptoms began several days ago. Interventions attempted: OTC medications: Tylenol . Symptoms are: gradually worsening.  Triage Disposition: No disposition on file.  Patient/caregiver understands and will follow disposition?:    Copied from CRM [id number]. Topic: Clinical - Red Word Triage >> Oct 02, 2023  1:06 PM Rosamond Comes wrote: Red Word that prompted transfer to Nurse Triage: patient wife Melba Spittle calling stating patient pulled muscle left triceps, pain when moving arm Reason for Disposition  [1] Arm pains with exertion (e.g., walking) AND [2] pain goes away on resting AND [3] not present now  Answer Assessment - Initial Assessment Questions 1. ONSET: "When did the pain start?"     3 Days ago  2. LOCATION: "Where is the pain located?"     Left Triceps  3. PAIN: "How bad is the pain?" (Scale 1-10; or mild, moderate, severe)   - MILD (1-3): Doesn't interfere with normal activities.   - MODERATE (4-7): Interferes with normal activities (e.g., work or school) or awakens from sleep.   - SEVERE (8-10): Excruciating pain, unable to do any normal activities, unable to hold a cup of water.     6, 10 with Movement  4. WORK OR EXERCISE: "Has there been any recent work or exercise that involved this part of the body?"     Doing pull ups, working out  5. CAUSE: "What do you think is causing the arm pain?"     Exercise  6. OTHER SYMPTOMS: "Do you have any other symptoms?" (e.g., neck pain, swelling, rash, fever, numbness, weakness)     Swelling  Protocols used: Arm Pain-A-AH

## 2023-10-02 NOTE — Telephone Encounter (Signed)
Patient is scheduled to see you today 

## 2023-10-02 NOTE — Progress Notes (Signed)
 Was doing pull ups.  Felt something pop and had pain at the L elbow, extensor side.  He stopped.  No bruising at that point.  Then had swelling a 2 days later.  Now pain with full/resisted extension.  No pain with flexion.  Normal grip.  Normal shoulder ROM.    Meds, vitals, and allergies reviewed.   ROS: Per HPI unless specifically indicated in ROS section   Nad Ncat Neck supple, no LA Normal left shoulder range of motion.  Flexion and extension intact at the elbow but he has pain with resisted/full extension of the elbow, proximal to the olecranon.  Biceps intact. Olecranon itself is not tender.  Distally neurovascular intact. No bruising or rash.

## 2023-10-02 NOTE — Telephone Encounter (Signed)
 Noted. Thanks.

## 2023-10-02 NOTE — Patient Instructions (Signed)
 Ice as needed, 5 minutes at a time.   Meloxicam  with food.   Let me know if you can't get set with ortho.  Could try a compression sleeve.   Take care.  Glad to see you.

## 2023-10-04 DIAGNOSIS — M79603 Pain in arm, unspecified: Secondary | ICD-10-CM | POA: Insufficient documentation

## 2023-10-04 NOTE — Assessment & Plan Note (Signed)
 Concern for L triceps strain vs partial tear.  Anatomy discussed with patient. Olecranon not ttp.  Defer imaging.  I do not suspect a fracture.  Ice as needed, 5 minutes at a time.   Meloxicam  with food.   Refer to Ortho and he will let me know if he cannot get that set up. Could try a compression sleeve.   Discussed relative rest in the meantime.

## 2023-10-05 ENCOUNTER — Ambulatory Visit (HOSPITAL_BASED_OUTPATIENT_CLINIC_OR_DEPARTMENT_OTHER): Admitting: Student

## 2023-10-05 ENCOUNTER — Encounter (HOSPITAL_BASED_OUTPATIENT_CLINIC_OR_DEPARTMENT_OTHER): Payer: Self-pay | Admitting: Student

## 2023-10-05 ENCOUNTER — Ambulatory Visit (HOSPITAL_BASED_OUTPATIENT_CLINIC_OR_DEPARTMENT_OTHER)

## 2023-10-05 DIAGNOSIS — S46302A Unspecified injury of muscle, fascia and tendon of triceps, left arm, initial encounter: Secondary | ICD-10-CM

## 2023-10-05 DIAGNOSIS — M79602 Pain in left arm: Secondary | ICD-10-CM | POA: Diagnosis not present

## 2023-10-05 DIAGNOSIS — M25522 Pain in left elbow: Secondary | ICD-10-CM | POA: Diagnosis not present

## 2023-10-05 NOTE — Progress Notes (Signed)
 Chief Complaint: Left arm pain     History of Present Illness:    Tanner Diaz is a 43 y.o. right-hand-dominant male presenting today for evaluation of an injury to his left arm.  He reports that last week he was performing chin ups when he felt a pop in the back of his arm above the elbow.  He has since been unable to fully extend the elbow and has had significant swelling in the area which has started to improve.  He denies any numbness or tingling.  Has been using ice, a compression sleeve, and Advil  although he was just prescribed meloxicam  by his PCP.  Denies any previous injury to the left arm.   Surgical History:   None  PMH/PSH/Family History/Social History/Meds/Allergies:    Past Medical History:  Diagnosis Date   GERD (gastroesophageal reflux disease)    History reviewed. No pertinent surgical history. Social History   Socioeconomic History   Marital status: Married    Spouse name: Not on file   Number of children: Not on file   Years of education: Not on file   Highest education level: Not on file  Occupational History   Occupation: Civil engineer, contracting: [employer]  Tobacco Use   Smoking status: Never    Passive exposure: Past   Smokeless tobacco: Never  Vaping Use   Vaping status: Never Used  Substance and Sexual Activity   Alcohol use: No    Alcohol/week: 0.0 standard drinks of alcohol   Drug use: No   Sexual activity: Not on file  Other Topics Concern   Not on file  Social History Narrative   [employer]   [employer] fan   Married 2004   4 daughters   Likes to Hydrologist   Social Drivers of Corporate investment banker Strain: Not on file  Food Insecurity: Not on file  Transportation Needs: Not on file  Physical Activity: Not on file  Stress: Not on file  Social Connections: Not on file   Family History  Problem Relation Age of Onset   Stroke Father    Diabetes Father    Diabetes Other    Colon  cancer Neg Hx    Prostate cancer Neg Hx    Allergies  Allergen Reactions   Milk-Related Compounds Other (See Comments)    GI upset   Current Outpatient Medications  Medication Sig Dispense Refill   EPINEPHrine  0.3 mg/0.3 mL IJ SOAJ injection Inject 0.3 mg into the muscle as needed for anaphylaxis. 2 each 1   loratadine  (CLARITIN ) 10 MG tablet Take 1 tablet (10 mg total) by mouth daily.     meloxicam  (MOBIC ) 15 MG tablet Take 0.5-1 tablets (7.5-15 mg total) by mouth daily. Take with food.  Don't take with ibuprofen  or aleve . 30 tablet 1   No current facility-administered medications for this visit.   No results found.  Review of Systems:   A ROS was performed including pertinent positives and negatives as documented in the HPI.  Physical Exam :   Constitutional: NAD and appears stated age Neurological: Alert and oriented Psych: Appropriate affect and cooperative There were no vitals taken for this visit.   Comprehensive Musculoskeletal Exam:    Tenderness in the posterior left elbow at the distal triceps insertion.  Active elbow range of motion is to 10 degrees extension and 130 degrees flexion.  Resisted elbow extension strength 4/5 compared to 5/5 on contralateral side.  Mild swelling in the posterior upper arm.  Imaging:   Xray (left humerus 2 views): Negative for bony abnormality   I personally reviewed and interpreted the radiographs.   Assessment:   43 y.o. male with an acute injury to the back of his left arm suffered while performing chin ups.  I discussed with him that this is concerning for a triceps injury, particularly given the weakness and difficulty fully extending the elbow.  I would like to now proceed with an MRI scan of the left arm to fully evaluate the tricep tendons as there is concern for at least a partial tear.  He does like to remain very active and this is preventing him from being able to perform higher level activities.  Will have him return to  clinic shortly after the MRI for review and treatment discussion with Dr. Hermina Loosen.  Plan :    - Obtain MRI of the left elbow/distal humerus and return to clinic for MRI review and treatment discussion with Dr. Hermina Loosen     I personally saw and evaluated the patient, and participated in the management and treatment plan.  Sharrell Deck, PA-C Orthopedics

## 2023-10-06 ENCOUNTER — Encounter (HOSPITAL_BASED_OUTPATIENT_CLINIC_OR_DEPARTMENT_OTHER): Payer: Self-pay | Admitting: Student

## 2023-10-07 ENCOUNTER — Ambulatory Visit
Admission: RE | Admit: 2023-10-07 | Discharge: 2023-10-07 | Discharge status: Home / Self Care | Source: Ambulatory Visit | Attending: Student

## 2023-10-07 DIAGNOSIS — M25522 Pain in left elbow: Secondary | ICD-10-CM

## 2023-10-09 ENCOUNTER — Telehealth (HOSPITAL_BASED_OUTPATIENT_CLINIC_OR_DEPARTMENT_OTHER): Payer: Self-pay | Admitting: Orthopaedic Surgery

## 2023-10-09 NOTE — Telephone Encounter (Signed)
 Called and talked to patient. Patient has gotten worse since visit on Monday. States it is a constant pain. Pain is starting to radiate to his forearm. Using the compression sleeve. Also still taking the meloxicam . States he wants to be seen before his scheduled appt. Advised him that the appt was made for Monday at 1:30p

## 2023-10-09 NOTE — Telephone Encounter (Signed)
 Patient states he has had his MRI and was told if he got worse to contact the office. Patient best contact [phone]

## 2023-10-12 ENCOUNTER — Ambulatory Visit (HOSPITAL_BASED_OUTPATIENT_CLINIC_OR_DEPARTMENT_OTHER): Admitting: Student

## 2023-10-12 ENCOUNTER — Encounter (HOSPITAL_BASED_OUTPATIENT_CLINIC_OR_DEPARTMENT_OTHER): Payer: Self-pay | Admitting: Student

## 2023-10-12 DIAGNOSIS — S46302A Unspecified injury of muscle, fascia and tendon of triceps, left arm, initial encounter: Secondary | ICD-10-CM

## 2023-10-12 NOTE — Progress Notes (Signed)
 Chief Complaint: Left arm pain     History of Present Illness:   10/12/23: Patient presents today for follow-up of left elbow and arm pain due to an injury sustained a few weeks ago while exercising.  He did have an MRI performed last week and is set to follow-up on this on 6/25 with Dr. Hermina Loosen.  Patient reports that shortly after the MRI, his pain levels began increasing and he experiences a frequent, sharp pain with certain motions.  This is particular bothersome with any pronation or supination of the forearm such as when twisting a doorknob.  He denies any numbness or tingling.  Has been icing and taking Advil  which gives some relief.   Tanner Diaz is a 43 y.o. right-hand-dominant male presenting today for evaluation of an injury to his left arm.  He reports that last week he was performing chin ups when he felt a pop in the back of his arm above the elbow.  He has since been unable to fully extend the elbow and has had significant swelling in the area which has started to improve.  He denies any numbness or tingling.  Has been using ice, a compression sleeve, and Advil  although he was just prescribed meloxicam  by his PCP.  Denies any previous injury to the left arm.  Works in Patent examiner.   Surgical History:   None  PMH/PSH/Family History/Social History/Meds/Allergies:    Past Medical History:  Diagnosis Date   GERD (gastroesophageal reflux disease)    History reviewed. No pertinent surgical history. Social History   Socioeconomic History   Marital status: Married    Spouse name: Not on file   Number of children: Not on file   Years of education: Not on file   Highest education level: Not on file  Occupational History   Occupation: Civil engineer, contracting: [employer]  Tobacco Use   Smoking status: Never    Passive exposure: Past   Smokeless tobacco: Never  Vaping Use   Vaping status: Never Used  Substance and Sexual  Activity   Alcohol use: No    Alcohol/week: 0.0 standard drinks of alcohol   Drug use: No   Sexual activity: Not on file  Other Topics Concern   Not on file  Social History Narrative   [employer]   [employer] fan   Married 2004   4 daughters   Likes to Hydrologist   Social Drivers of Corporate investment banker Strain: Not on file  Food Insecurity: Not on file  Transportation Needs: Not on file  Physical Activity: Not on file  Stress: Not on file  Social Connections: Not on file   Family History  Problem Relation Age of Onset   Stroke Father    Diabetes Father    Diabetes Other    Colon cancer Neg Hx    Prostate cancer Neg Hx    Allergies  Allergen Reactions   Milk-Related Compounds Other (See Comments)    GI upset   Current Outpatient Medications  Medication Sig Dispense Refill   EPINEPHrine  0.3 mg/0.3 mL IJ SOAJ injection Inject 0.3 mg into the muscle as needed for anaphylaxis. 2 each 1   loratadine  (CLARITIN ) 10 MG tablet Take 1 tablet (10 mg total) by mouth daily.  meloxicam  (MOBIC ) 15 MG tablet Take 0.5-1 tablets (7.5-15 mg total) by mouth daily. Take with food.  Don't take with ibuprofen  or aleve . 30 tablet 1   No current facility-administered medications for this visit.   No results found.  Review of Systems:   A ROS was performed including pertinent positives and negatives as documented in the HPI.  Physical Exam :   Constitutional: NAD and appears stated age Neurological: Alert and oriented Psych: Appropriate affect and cooperative There were no vitals taken for this visit.   Comprehensive Musculoskeletal Exam:    Tenderness in the left elbow noted posteriorly at the olecranon, lateral epicondyle, and radiating into the proximal and radial aspect of the forearm.  Active elbow ROM from 10 to 130 degrees.  Mild discomfort elicited with resisted wrist extension.  Pain and difficulty with active forearm pronation and supination.  Imaging:     Assessment:    43 y.o. male with continued pain in the left arm and elbow after an injury sustained approximately a week and a half ago.  Patient recently had his MRI completed although states that likely as a result of positioning for the MRI, his pain levels have since increased.  He has no pain with motion, although particularly forearm pronation and supination causes discomfort more at the lateral elbow.  He is set to review MRI next week with Dr. Hermina Loosen and to discuss treatment options, although review of MRI report suggests 2 areas of full-thickness cartilage loss, mild tearing within the medial and long heads of the triceps, common flexor tendinosis, and a mild UCL sprain.  There is some suspicion today for lateral epicondylitis although this was not seen on recent MRI and special testing is not conclusive.  Given that symptoms are only reproduced her movement, I would recommend immobilization for the next week followed by return to activity motion as tolerated prior to MRI review to see how his elbow responds.  Patient is agreeable with this plan and sling was provided today.  Recommend trial of meloxicam  which he does currently have a prescription for.  Plan :    - Sling provided today for immobilization and follow-up as scheduled on 6/25 for MRI review     I personally saw and evaluated the patient, and participated in the management and treatment plan.  Sharrell Deck, PA-C Orthopedics

## 2023-10-13 ENCOUNTER — Ambulatory Visit: Admitting: Physician Assistant

## 2023-10-13 ENCOUNTER — Encounter: Payer: Self-pay | Admitting: Physician Assistant

## 2023-10-13 VITALS — BP 147/96

## 2023-10-13 DIAGNOSIS — L858 Other specified epidermal thickening: Secondary | ICD-10-CM

## 2023-10-13 DIAGNOSIS — B360 Pityriasis versicolor: Secondary | ICD-10-CM | POA: Diagnosis not present

## 2023-10-13 DIAGNOSIS — L810 Postinflammatory hyperpigmentation: Secondary | ICD-10-CM

## 2023-10-13 DIAGNOSIS — L700 Acne vulgaris: Secondary | ICD-10-CM

## 2023-10-13 MED ORDER — KETOCONAZOLE 2 % EX SHAM: 1.0000 | MEDICATED_SHAMPOO | CUTANEOUS | 6 refills | Status: DC

## 2023-10-13 NOTE — Progress Notes (Signed)
 New Patient Visit   Subjective  Tanner Diaz is a 43 y.o. male who presents for the following: Discoloration of upper lip. His lips swells then gets chapped.After it heals it leaves a discoloration. This has happened 3 times. He also has raised bumps of his upper arms. They do not itch. He does not think they have been there. He also has some blotches of his back and it is itchy. He also thinks he may have an ingrown hair of his left chest.    The following portions of the chart were reviewed this encounter and updated as appropriate: medications, allergies, medical history  Review of Systems:  No other skin or systemic complaints except as noted in HPI or Assessment and Plan.  Objective  Well appearing patient in no apparent distress; mood and affect are within normal limits.   A focused examination was performed of the following areas: Face, arms, back and chest.    Relevant exam findings are noted in the Assessment and Plan.      Assessment & Plan   POST-INFLAMMATORY HYPERPIGMENTATION (PIH) Exam: hyperpigmented macules and/or patches of upper lip   This is a benign condition that comes from having previous inflammation in the skin and will fade with time over months to sometimes years. Recommend daily sun protection including sunscreen SPF 30+ to sun-exposed areas. - Recommend treating any itchy or red areas on the skin quickly to prevent new areas of PIH. Treating with prescription medicines such as hydroquinone may help fade dark spots faster.    Treatment Plan:  Recommend observation. Call if area becomes darker. Advised patient discoloration will take time to fade. Recommend Aquaphor. Call if rash recurs (he has not had since last August).   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 No cure. - Observe. - If desired, patient can use an emollient (moisturizer) containing ammonium lactate (AmLactin), urea or salicylic  acid once a day to smooth the area  Recommend starting moisturizer with exfoliant (Urea, Salicylic acid, or Lactic acid) one to two times daily to help smooth rough and bumpy skin.  OTC options include Cetaphil Rough and Bumpy lotion (Urea), Eucerin Roughness Relief lotion or spot treatment cream (Urea), CeraVe SA lotion/cream for Rough and Bumpy skin (Sal Acid), Gold Bond Rough and Bumpy cream (Sal Acid), and AmLactin 12% lotion/cream (Lactic Acid).  If applying in morning, also apply sunscreen to sun-exposed areas, since these exfoliating moisturizers can increase sensitivity to sun.    Tinea Versicolor - hypopigmented patches   Wash trunk daily until clear then wash weekly for prevention. Let sit a few minutes then rinse.  Tinea versicolor is a chronic recurrent skin rash causing discolored scaly spots most commonly seen on back, chest, and/or shoulders.  It is generally asymptomatic. The rash is due to overgrowth of a common type of yeast present on everyone's skin and it is not contagious.  It tends to flare more in the summer due to increased sweating on trunk.  After rash is treated, the scaliness will resolve, but the discoloration will take longer to return to normal pigmentation. The periodic use of an OTC medicated soap/shampoo with zinc or selenium  sulfide can be helpful to prevent yeast overgrowth and recurrence.   Comedone Exam: Comedone of left chest  Treatment Plan: No treatment at this time,].  POST-INFLAMMATORY HYPERPIGMENTATION   KERATOSIS PILARIS   TINEA VERSICOLOR   COMEDONE    Return if symptoms worsen or fail  to improve.  I, Eliot Guernsey, CMA, am acting as scribe for Jaton Eilers K, PA-C .   Documentation: I have reviewed the above documentation for accuracy and completeness, and I agree with the above.  Romond Pipkins K, PA-C

## 2023-10-13 NOTE — Patient Instructions (Signed)
 KERATOSIS PILARIS - Tiny follicular keratotic papules - Benign. Genetic in nature. No cure. - Observe. - If desired, patient can use an emollient (moisturizer) containing ammonium lactate (AmLactin), urea or salicylic acid once a day to smooth the area  Recommend starting moisturizer with exfoliant (Urea, Salicylic acid, or Lactic acid) one to two times daily to help smooth rough and bumpy skin.  OTC options include Cetaphil Rough and Bumpy lotion (Urea), Eucerin Roughness Relief lotion or spot treatment cream (Urea), CeraVe SA lotion/cream for Rough and Bumpy skin (Sal Acid), Gold Bond Rough and Bumpy cream (Sal Acid), and AmLactin 12% lotion/cream (Lactic Acid).  If applying in morning, also apply sunscreen to sun-exposed areas, since these exfoliating moisturizers can increase sensitivity to sun.    Important Information  Due to recent changes in healthcare laws, you may see results of your pathology and/or laboratory studies on MyChart before the doctors have had a chance to review them. We understand that in some cases there may be results that are confusing or concerning to you. Please understand that not all results are received at the same time and often the doctors may need to interpret multiple results in order to provide you with the best plan of care or course of treatment. Therefore, we ask that you please give us  2 business days to thoroughly review all your results before contacting the office for clarification. Should we see a critical lab result, you will be contacted sooner.   If You Need Anything After Your Visit  If you have any questions or concerns for your doctor, please call our main line at [phone] If no one answers, please leave a voicemail as directed and we will return your call as soon as possible. Messages left after 4 pm will be answered the following business day.   You may also send us  a message via MyChart. We typically respond to MyChart messages within 1-2  business days.  For prescription refills, please ask your pharmacy to contact our office. Our fax number is [phone].  If you have an urgent issue when the clinic is closed that cannot wait until the next business day, you can page your doctor at the number below.    Please note that while we do our best to be available for urgent issues outside of office hours, we are not available 24/7.   If you have an urgent issue and are unable to reach us , you may choose to seek medical care at your doctor's office, retail clinic, urgent care center, or emergency room.  If you have a medical emergency, please immediately call 911 or go to the emergency department. In the event of inclement weather, please call our main line at [phone] for an update on the status of any delays or closures.  Dermatology Medication Tips: Please keep the boxes that topical medications come in in order to help keep track of the instructions about where and how to use these. Pharmacies typically print the medication instructions only on the boxes and not directly on the medication tubes.   If your medication is too expensive, please contact our office at [phone] or send us  a message through MyChart.   We are unable to tell what your co-pay for medications will be in advance as this is different depending on your insurance coverage. However, we may be able to find a substitute medication at lower cost or fill out paperwork to get insurance to cover a needed medication.   If a prior  authorization is required to get your medication covered by your insurance company, please allow us  1-2 business days to complete this process.  Drug prices often vary depending on where the prescription is filled and some pharmacies may offer cheaper prices.  The website www.goodrx.com contains coupons for medications through different pharmacies. The prices here do not account for what the cost may be with help from insurance (it may  be cheaper with your insurance), but the website can give you the price if you did not use any insurance.  - You can print the associated coupon and take it with your prescription to the pharmacy.  - You may also stop by our office during regular business hours and pick up a GoodRx coupon card.  - If you need your prescription sent electronically to a different pharmacy, notify our office through [facility] or by phone at [phone]

## 2023-10-21 ENCOUNTER — Other Ambulatory Visit (HOSPITAL_BASED_OUTPATIENT_CLINIC_OR_DEPARTMENT_OTHER): Payer: Self-pay

## 2023-10-21 ENCOUNTER — Ambulatory Visit (HOSPITAL_BASED_OUTPATIENT_CLINIC_OR_DEPARTMENT_OTHER): Payer: Self-pay | Admitting: Orthopaedic Surgery

## 2023-10-21 ENCOUNTER — Ambulatory Visit (HOSPITAL_BASED_OUTPATIENT_CLINIC_OR_DEPARTMENT_OTHER): Admitting: Orthopaedic Surgery

## 2023-10-21 DIAGNOSIS — M25522 Pain in left elbow: Secondary | ICD-10-CM | POA: Diagnosis not present

## 2023-10-21 DIAGNOSIS — M24122 Other articular cartilage disorders, left elbow: Secondary | ICD-10-CM

## 2023-10-21 MED ORDER — IBUPROFEN 800 MG PO TABS
800.0000 mg | ORAL_TABLET | Freq: Three times a day (TID) | ORAL | 0 refills | Status: AC
  Filled 2023-10-21: qty 30, 10d supply, fill #0

## 2023-10-21 MED ORDER — OXYCODONE HCL 5 MG PO TABS
5.0000 mg | ORAL_TABLET | ORAL | 0 refills | Status: DC | PRN
  Filled 2023-10-21: qty 10, 2d supply, fill #0

## 2023-10-21 MED ORDER — ACETAMINOPHEN 500 MG PO TABS
500.0000 mg | ORAL_TABLET | Freq: Three times a day (TID) | ORAL | 0 refills | Status: AC
  Filled 2023-10-21: qty 30, 10d supply, fill #0

## 2023-10-21 MED ORDER — ASPIRIN 325 MG PO TBEC
325.0000 mg | DELAYED_RELEASE_TABLET | Freq: Every day | ORAL | 0 refills | Status: DC
  Filled 2023-10-21: qty 14, 14d supply, fill #0

## 2023-10-21 NOTE — Progress Notes (Signed)
 Chief Complaint: Left arm pain        History of Present Illness:    10/21/2023: Presents today for follow-up and MRI discussion of the left arm.  He is still experiencing pain and limited motion particularly about the triceps   Tanner Diaz is a 43 y.o. right-hand-dominant male presenting today for evaluation of an injury to his left arm.  He reports that last week he was performing chin ups when he felt a pop in the back of his arm above the elbow.  He has since been unable to fully extend the elbow and has had significant swelling in the area which has started to improve.  He denies any numbness or tingling.  Has been using ice, a compression sleeve, and Advil  although he was just prescribed meloxicam  by his PCP.  Denies any previous injury to the left arm.     Surgical History:   None   PMH/PSH/Family History/Social History/Meds/Allergies:         Past Medical History:  Diagnosis Date   GERD (gastroesophageal reflux disease)          History reviewed. No pertinent surgical history.     Social History         Socioeconomic History   Marital status: Married      Spouse name: Not on file   Number of children: Not on file   Years of education: Not on file   Highest education level: Not on file  Occupational History   Occupation: Doctor, general practice: [employer]  Tobacco Use   Smoking status: Never      Passive exposure: Past   Smokeless tobacco: Never  Vaping Use   Vaping status: Never Used  Substance and Sexual Activity   Alcohol use: No      Alcohol/week: 0.0 standard drinks of alcohol   Drug use: No   Sexual activity: Not on file  Other Topics Concern   Not on file  Social History Narrative    [employer]    [employer] fan    Married 2004    4 daughters    Likes to Hydrologist    Social Drivers of Manufacturing engineer Strain: Not on file  Food Insecurity: Not on file  Transportation Needs: Not on file   Physical Activity: Not on file  Stress: Not on file  Social Connections: Not on file         Family History  Problem Relation Age of Onset   Stroke Father     Diabetes Father     Diabetes Other     Colon cancer Neg Hx     Prostate cancer Neg Hx          Allergies       Allergies  Allergen Reactions   Milk-Related Compounds Other (See Comments)      GI upset            Current Outpatient Medications  Medication Sig Dispense Refill   EPINEPHrine  0.3 mg/0.3 mL IJ SOAJ injection Inject 0.3 mg into the muscle as needed for anaphylaxis. 2 each 1   loratadine  (CLARITIN ) 10 MG tablet Take 1 tablet (10 mg total) by mouth daily.       meloxicam  (MOBIC ) 15 MG tablet Take 0.5-1 tablets (7.5-15 mg total) by mouth daily. Take with food.  Don't take with ibuprofen  or aleve . 30 tablet 1  No current facility-administered medications for this visit.      Imaging Results (Last 48 hours)  No results found.     Review of Systems:   A ROS was performed including pertinent positives and negatives as documented in the HPI.   Physical Exam :   Constitutional: NAD and appears stated age Neurological: Alert and oriented Psych: Appropriate affect and cooperative There were no vitals taken for this visit.    Comprehensive Musculoskeletal Exam:     Tenderness in the posterior left elbow at the distal triceps insertion.  Active elbow range of motion is to 10 degrees extension and 130 degrees flexion.  Resisted elbow extension strength 4/5 compared to 5/5 on contralateral side.  Mild swelling in the posterior upper arm.   Imaging:   Xray (left humerus 2 views): Negative for bony abnormality   MRI left elbow: Full-thickness chondral lesion involving the capitellum I personally reviewed and interpreted the radiographs.     Assessment:   43 y.o. male with an acute injury to the back of his left arm suffered while performing chin ups.  At this time he does have a full-thickness  chondral lesion involving the capitellum which does measure approximately 1 cm.  Given this I do believe that he would be a candidate for elbow arthroscopy with debridement and microfracture with chondral pick.  I did discuss the associated recovery timeframe.  I did discuss that he has already trialed activity restriction with conservative management without any relief.  Given the fact that he does have a fairly large chondral lesion I do not believe that this would respond to physical therapy.  After discussion he is elected to proceed with surgical intervention Plan :     - Plan for left elbow arthroscopy with capitellum microfracture   After a lengthy discussion of treatment options, including risks, benefits, alternatives, complications of surgical and nonsurgical conservative options, the patient elected surgical repair.   The patient  is aware of the material risks  and complications including, but not limited to injury to adjacent structures, neurovascular injury, infection, numbness, bleeding, implant failure, thermal burns, stiffness, persistent pain, failure to heal, disease transmission from allograft, need for further surgery, dislocation, anesthetic risks, blood clots, risks of death,and others. The probabilities of surgical success and failure discussed with patient given their particular co-morbidities.The time and nature of expected rehabilitation and recovery was discussed.The patient's questions were all answered preoperatively.  No barriers to understanding were noted. I explained the natural history of the disease process and Rx rationale.  I explained to the patient what I considered to be reasonable expectations given their personal situation.  The final treatment plan was arrived at through a shared patient decision making process model.          I personally saw and evaluated the patient, and participated in the management and treatment plan.

## 2023-10-29 ENCOUNTER — Other Ambulatory Visit (HOSPITAL_BASED_OUTPATIENT_CLINIC_OR_DEPARTMENT_OTHER): Payer: Self-pay | Admitting: Orthopaedic Surgery

## 2023-10-29 DIAGNOSIS — M24122 Other articular cartilage disorders, left elbow: Secondary | ICD-10-CM

## 2023-11-13 ENCOUNTER — Telehealth (HOSPITAL_BASED_OUTPATIENT_CLINIC_OR_DEPARTMENT_OTHER): Payer: Self-pay | Admitting: Orthopaedic Surgery

## 2023-11-13 NOTE — Telephone Encounter (Signed)
 Patient dropped off fmal papers placed on your desk. Please possible upload to his mychart if not he will come and pick them up. Please update his work note according to the dates of surgery and upload to his maychart

## 2023-11-16 ENCOUNTER — Encounter (HOSPITAL_BASED_OUTPATIENT_CLINIC_OR_DEPARTMENT_OTHER): Payer: Self-pay | Admitting: Orthopaedic Surgery

## 2023-11-16 NOTE — Telephone Encounter (Signed)
 Responded via MyChart and sent Tammy H paperwork

## 2023-11-17 ENCOUNTER — Other Ambulatory Visit: Payer: Self-pay

## 2023-11-17 ENCOUNTER — Encounter (HOSPITAL_BASED_OUTPATIENT_CLINIC_OR_DEPARTMENT_OTHER): Payer: Self-pay | Admitting: Orthopaedic Surgery

## 2023-11-17 NOTE — Progress Notes (Signed)
   11/17/23 1233  PAT Phone Screen  Is the patient taking a GLP-1 receptor agonist? No  Do You Have Diabetes? No  Do You Have Hypertension? No  Have You Ever Been to the ER for Asthma? No  Have You Taken Oral Steroids in the Past 3 Months? No  Do you Take Phenteramine or any Other Diet Drugs? No  Recent  Lab Work, EKG, CXR? No  Do you have a history of heart problems? No  Cardiologist Name Consult w/ Dr Tobi 12/23/21 for chest discomfort- diagnosis GERD to f/u prn  Have you ever had tests on your heart? Yes  What cardiac tests were performed? Stress Test;EKG  What date/year were cardiac tests completed? 12/09/21 EKG NSR;  normal Stress Test 01/07/22  Results viewable: Care Everywhere  Any Recent Hospitalizations? No  Height 5' 10 (1.778 m)  Weight 111 kg  Pat Appointment Scheduled No  Reason for No Appointment Not Needed

## 2023-11-24 ENCOUNTER — Other Ambulatory Visit: Payer: Self-pay

## 2023-11-24 ENCOUNTER — Encounter (HOSPITAL_BASED_OUTPATIENT_CLINIC_OR_DEPARTMENT_OTHER): Payer: Self-pay | Admitting: Orthopaedic Surgery

## 2023-11-24 ENCOUNTER — Ambulatory Visit (HOSPITAL_BASED_OUTPATIENT_CLINIC_OR_DEPARTMENT_OTHER): Payer: Self-pay | Admitting: Certified Registered"

## 2023-11-24 ENCOUNTER — Encounter (HOSPITAL_BASED_OUTPATIENT_CLINIC_OR_DEPARTMENT_OTHER)
Admission: RE | Disposition: A | Discharge status: Home / Self Care | Payer: Self-pay | Source: Home / Self Care | Attending: Orthopaedic Surgery

## 2023-11-24 ENCOUNTER — Encounter (HOSPITAL_BASED_OUTPATIENT_CLINIC_OR_DEPARTMENT_OTHER): Payer: Self-pay | Admitting: Certified Registered"

## 2023-11-24 ENCOUNTER — Ambulatory Visit (HOSPITAL_BASED_OUTPATIENT_CLINIC_OR_DEPARTMENT_OTHER)
Admission: RE | Admit: 2023-11-24 | Discharge: 2023-11-24 | Disposition: A | Discharge status: Home / Self Care | Attending: Orthopaedic Surgery | Admitting: Orthopaedic Surgery

## 2023-11-24 DIAGNOSIS — M24122 Other articular cartilage disorders, left elbow: Secondary | ICD-10-CM

## 2023-11-24 DIAGNOSIS — S59802A Other specified injuries of left elbow, initial encounter: Secondary | ICD-10-CM | POA: Insufficient documentation

## 2023-11-24 DIAGNOSIS — M93222 Osteochondritis dissecans, left elbow: Secondary | ICD-10-CM | POA: Diagnosis not present

## 2023-11-24 DIAGNOSIS — X58XXXA Exposure to other specified factors, initial encounter: Secondary | ICD-10-CM | POA: Diagnosis not present

## 2023-11-24 DIAGNOSIS — M24022 Loose body in left elbow: Secondary | ICD-10-CM | POA: Insufficient documentation

## 2023-11-24 HISTORY — PX: ELBOW ARTHROSCOPY: SHX614

## 2023-11-24 SURGERY — ARTHROSCOPY, ELBOW
Anesthesia: Monitor Anesthesia Care | Site: Elbow | Laterality: Left

## 2023-11-24 MED ORDER — PROPOFOL 500 MG/50ML IV EMUL
INTRAVENOUS | Status: DC | PRN
  Administered 2023-11-24 (×2): 150 ug/kg/min via INTRAVENOUS

## 2023-11-24 MED ORDER — GABAPENTIN 300 MG PO CAPS
ORAL_CAPSULE | ORAL | Status: AC
  Filled 2023-11-24: qty 1

## 2023-11-24 MED ORDER — PROPOFOL 10 MG/ML IV BOLUS
INTRAVENOUS | Status: DC | PRN
  Administered 2023-11-24: 200 mg via INTRAVENOUS

## 2023-11-24 MED ORDER — CEFAZOLIN SODIUM-DEXTROSE 2-4 GM/100ML-% IV SOLN
2.0000 g | INTRAVENOUS | Status: AC
  Administered 2023-11-24: 2 g via INTRAVENOUS

## 2023-11-24 MED ORDER — MIDAZOLAM HCL 2 MG/2ML IJ SOLN
2.0000 mg | Freq: Once | INTRAMUSCULAR | Status: AC
Start: 2023-11-24 — End: 2023-11-24
  Administered 2023-11-24: 2 mg via INTRAVENOUS

## 2023-11-24 MED ORDER — PHENYLEPHRINE 80 MCG/ML (10ML) SYRINGE FOR IV PUSH (FOR BLOOD PRESSURE SUPPORT)
PREFILLED_SYRINGE | INTRAVENOUS | Status: AC
  Filled 2023-11-24: qty 10

## 2023-11-24 MED ORDER — TRANEXAMIC ACID-NACL 1000-0.7 MG/100ML-% IV SOLN
1000.0000 mg | INTRAVENOUS | Status: AC
  Administered 2023-11-24: 1000 mg via INTRAVENOUS

## 2023-11-24 MED ORDER — GABAPENTIN 300 MG PO CAPS
300.0000 mg | ORAL_CAPSULE | Freq: Once | ORAL | Status: AC
  Administered 2023-11-24: 300 mg via ORAL

## 2023-11-24 MED ORDER — FENTANYL CITRATE (PF) 100 MCG/2ML IJ SOLN
INTRAMUSCULAR | Status: AC
  Filled 2023-11-24: qty 2

## 2023-11-24 MED ORDER — DEXAMETHASONE SODIUM PHOSPHATE 10 MG/ML IJ SOLN
INTRAMUSCULAR | Status: AC
  Filled 2023-11-24: qty 1

## 2023-11-24 MED ORDER — PROPOFOL 1000 MG/100ML IV EMUL
INTRAVENOUS | Status: AC
Start: 2023-11-24 — End: 2023-11-24
  Filled 2023-11-24: qty 100

## 2023-11-24 MED ORDER — BUPIVACAINE-EPINEPHRINE (PF) 0.5% -1:200000 IJ SOLN
INTRAMUSCULAR | Status: DC | PRN
Start: 2023-11-24 — End: 2023-11-24
  Administered 2023-11-24: 30 mL via PERINEURAL

## 2023-11-24 MED ORDER — PROPOFOL 500 MG/50ML IV EMUL
INTRAVENOUS | Status: AC
Start: 2023-11-24 — End: 2023-11-24
  Filled 2023-11-24: qty 50

## 2023-11-24 MED ORDER — FENTANYL CITRATE (PF) 100 MCG/2ML IJ SOLN
100.0000 ug | Freq: Once | INTRAMUSCULAR | Status: AC
  Administered 2023-11-24: 100 ug via INTRAVENOUS

## 2023-11-24 MED ORDER — DEXAMETHASONE SODIUM PHOSPHATE 10 MG/ML IJ SOLN
INTRAMUSCULAR | Status: DC | PRN
  Administered 2023-11-24: 10 mg via INTRAVENOUS

## 2023-11-24 MED ORDER — PROPOFOL 10 MG/ML IV BOLUS
INTRAVENOUS | Status: AC
  Filled 2023-11-24: qty 20

## 2023-11-24 MED ORDER — ONDANSETRON HCL 4 MG/2ML IJ SOLN
INTRAMUSCULAR | Status: DC | PRN
  Administered 2023-11-24: 4 mg via INTRAVENOUS

## 2023-11-24 MED ORDER — CLONIDINE HCL (ANALGESIA) 100 MCG/ML EP SOLN
EPIDURAL | Status: DC | PRN
  Administered 2023-11-24: 100 ug

## 2023-11-24 MED ORDER — OXYCODONE HCL 5 MG PO TABS: 5.0000 mg | ORAL_TABLET | Freq: Once | ORAL | Status: DC | PRN

## 2023-11-24 MED ORDER — LIDOCAINE HCL (CARDIAC) PF 100 MG/5ML IV SOSY
PREFILLED_SYRINGE | INTRAVENOUS | Status: DC | PRN
Start: 2023-11-24 — End: 2023-11-24
  Administered 2023-11-24: 100 mg via INTRAVENOUS

## 2023-11-24 MED ORDER — PHENYLEPHRINE HCL-NACL 20-0.9 MG/250ML-% IV SOLN
INTRAVENOUS | Status: DC | PRN
  Administered 2023-11-24: 50 ug/min via INTRAVENOUS

## 2023-11-24 MED ORDER — FENTANYL CITRATE (PF) 100 MCG/2ML IJ SOLN
INTRAMUSCULAR | Status: DC | PRN
  Administered 2023-11-24 (×2): 25 ug via INTRAVENOUS
  Administered 2023-11-24: 50 ug via INTRAVENOUS

## 2023-11-24 MED ORDER — SODIUM CHLORIDE 0.9 % IR SOLN
Status: DC | PRN
  Administered 2023-11-24 (×5): 3000 mL

## 2023-11-24 MED ORDER — LIDOCAINE 2% (20 MG/ML) 5 ML SYRINGE
INTRAMUSCULAR | Status: AC
  Filled 2023-11-24: qty 5

## 2023-11-24 MED ORDER — 0.9 % SODIUM CHLORIDE (POUR BTL) OPTIME
TOPICAL | Status: DC | PRN
  Administered 2023-11-24: 1000 mL

## 2023-11-24 MED ORDER — MIDAZOLAM HCL 2 MG/2ML IJ SOLN
INTRAMUSCULAR | Status: AC
  Filled 2023-11-24: qty 2

## 2023-11-24 MED ORDER — FENTANYL CITRATE (PF) 100 MCG/2ML IJ SOLN: 25.0000 ug | INTRAMUSCULAR | Status: DC | PRN

## 2023-11-24 MED ORDER — PHENYLEPHRINE HCL (PRESSORS) 10 MG/ML IV SOLN
INTRAVENOUS | Status: DC | PRN
  Administered 2023-11-24: 120 ug via INTRAVENOUS
  Administered 2023-11-24 (×2): 80 ug via INTRAVENOUS
  Administered 2023-11-24: 120 ug via INTRAVENOUS
  Administered 2023-11-24: 80 ug via INTRAVENOUS
  Administered 2023-11-24: 120 ug via INTRAVENOUS
  Administered 2023-11-24: 80 ug via INTRAVENOUS

## 2023-11-24 MED ORDER — CEFAZOLIN SODIUM-DEXTROSE 2-4 GM/100ML-% IV SOLN
INTRAVENOUS | Status: AC
  Filled 2023-11-24: qty 100

## 2023-11-24 MED ORDER — ACETAMINOPHEN 500 MG PO TABS
1000.0000 mg | ORAL_TABLET | Freq: Once | ORAL | Status: AC
  Administered 2023-11-24: 1000 mg via ORAL

## 2023-11-24 MED ORDER — ACETAMINOPHEN 500 MG PO TABS
ORAL_TABLET | ORAL | Status: AC
  Filled 2023-11-24: qty 2

## 2023-11-24 MED ORDER — LACTATED RINGERS IV SOLN: INTRAVENOUS | Status: DC

## 2023-11-24 MED ORDER — OXYCODONE HCL 5 MG/5ML PO SOLN: 5.0000 mg | Freq: Once | ORAL | Status: DC | PRN

## 2023-11-24 MED ORDER — DROPERIDOL 2.5 MG/ML IJ SOLN: 0.6250 mg | Freq: Once | INTRAMUSCULAR | Status: DC | PRN

## 2023-11-24 MED ORDER — ONDANSETRON HCL 4 MG/2ML IJ SOLN
INTRAMUSCULAR | Status: AC
  Filled 2023-11-24: qty 2

## 2023-11-24 MED ORDER — TRANEXAMIC ACID-NACL 1000-0.7 MG/100ML-% IV SOLN
INTRAVENOUS | Status: AC
  Filled 2023-11-24: qty 100

## 2023-11-24 SURGICAL SUPPLY — 38 items
BLADE EXCALIBUR 4.0X13 (MISCELLANEOUS) IMPLANT
BLADE SURG 15 STRL LF DISP TIS (BLADE) IMPLANT
BNDG ELASTIC 4INX 5YD STR LF (GAUZE/BANDAGES/DRESSINGS) ×2 IMPLANT
BNDG ELASTIC 6INX 5YD STR LF (GAUZE/BANDAGES/DRESSINGS) IMPLANT
CHLORAPREP W/TINT 26 (MISCELLANEOUS) ×1 IMPLANT
COVER MAYO STAND STRL (DRAPES) ×1 IMPLANT
DISSECTOR 3.8MM X 13CM (MISCELLANEOUS) ×1 IMPLANT
DRAPE ARTHROSCOPY W/POUCH 90 (DRAPES) ×1 IMPLANT
DRAPE IMP U-DRAPE 54X76 (DRAPES) ×1 IMPLANT
DRAPE INCISE IOBAN 66X45 STRL (DRAPES) IMPLANT
DRAPE U-SHAPE 47X51 STRL (DRAPES) ×1 IMPLANT
DW OUTFLOW CASSETTE/TUBE SET (MISCELLANEOUS) IMPLANT
ELECTRODE REM PT RTRN 9FT ADLT (ELECTROSURGICAL) ×1 IMPLANT
EXCALIBUR 3.8MM X 13CM (MISCELLANEOUS) IMPLANT
GAUZE PAD ABD 8X10 STRL (GAUZE/BANDAGES/DRESSINGS) ×1 IMPLANT
GAUZE SPONGE 4X4 12PLY STRL (GAUZE/BANDAGES/DRESSINGS) ×1 IMPLANT
GAUZE XEROFORM 1X8 LF (GAUZE/BANDAGES/DRESSINGS) IMPLANT
GLOVE BIO SURGEON STRL SZ 6 (GLOVE) ×1 IMPLANT
GLOVE BIO SURGEON STRL SZ7.5 (GLOVE) ×1 IMPLANT
GLOVE BIOGEL PI IND STRL 6.5 (GLOVE) ×1 IMPLANT
GLOVE BIOGEL PI IND STRL 8 (GLOVE) ×1 IMPLANT
GOWN STRL REUS W/ TWL LRG LVL3 (GOWN DISPOSABLE) ×1 IMPLANT
GOWN STRL REUS W/ TWL XL LVL3 (GOWN DISPOSABLE) ×1 IMPLANT
MANIFOLD NEPTUNE II (INSTRUMENTS) ×1 IMPLANT
NDL SUT 6 .5 CRC .975X.05 MAYO (NEEDLE) IMPLANT
PACK ARTHROSCOPY DSU (CUSTOM PROCEDURE TRAY) ×1 IMPLANT
PACK BASIN DAY SURGERY FS (CUSTOM PROCEDURE TRAY) ×1 IMPLANT
PADDING CAST COTTON 6X4 STRL (CAST SUPPLIES) IMPLANT
PENCIL SMOKE EVACUATOR (MISCELLANEOUS) IMPLANT
PICK POWER XL 45DEG (MISCELLANEOUS) IMPLANT
SHAVER DISSECTOR 3.0 (BURR) IMPLANT
SLEEVE SCD COMPRESS KNEE MED (STOCKING) ×1 IMPLANT
SPLINT FIBERGLASS 4X30 (CAST SUPPLIES) IMPLANT
SUT ETHILON 3 0 PS 1 (SUTURE) ×1 IMPLANT
SUT VIC AB 3-0 FS2 27 (SUTURE) IMPLANT
TOWEL GREEN STERILE FF (TOWEL DISPOSABLE) ×1 IMPLANT
TUBING ARTHROSCOPY IRRIG 16FT (MISCELLANEOUS) ×1 IMPLANT
WAND ABLATOR APOLLO I90 (BUR) IMPLANT

## 2023-11-24 NOTE — H&P (Signed)
 Chief Complaint: Left arm pain        History of Present Illness:    10/21/2023: Presents today for follow-up and MRI discussion of the left arm.  He is still experiencing pain and limited motion particularly about the triceps   Tanner Diaz is a 43 y.o. right-hand-dominant male presenting today for evaluation of an injury to his left arm.  He reports that last week he was performing chin ups when he felt a pop in the back of his arm above the elbow.  He has since been unable to fully extend the elbow and has had significant swelling in the area which has started to improve.  He denies any numbness or tingling.  Has been using ice, a compression sleeve, and Advil  although he was just prescribed meloxicam  by his PCP.  Denies any previous injury to the left arm.     Surgical History:   None   PMH/PSH/Family History/Social History/Meds/Allergies:            Past Medical History:  Diagnosis Date   GERD (gastroesophageal reflux disease)           History reviewed. No pertinent surgical history.       Social History             Socioeconomic History   Marital status: Married      Spouse name: Not on file   Number of children: Not on file   Years of education: Not on file   Highest education level: Not on file  Occupational History   Occupation: Doctor, general practice: [employer]  Tobacco Use   Smoking status: Never      Passive exposure: Past   Smokeless tobacco: Never  Vaping Use   Vaping status: Never Used  Substance and Sexual Activity   Alcohol use: No      Alcohol/week: 0.0 standard drinks of alcohol   Drug use: No   Sexual activity: Not on file  Other Topics Concern   Not on file  Social History Narrative    [employer]    [employer] fan    Married 2004    4 daughters    Likes to Hydrologist    Social Drivers of Manufacturing engineer Strain: Not on file  Food Insecurity: Not on file  Transportation Needs: Not on file   Physical Activity: Not on file  Stress: Not on file  Social Connections: Not on file             Family History  Problem Relation Age of Onset   Stroke Father     Diabetes Father     Diabetes Other     Colon cancer Neg Hx     Prostate cancer Neg Hx           Allergies           Allergies  Allergen Reactions   Milk-Related Compounds Other (See Comments)      GI upset                 Current Outpatient Medications  Medication Sig Dispense Refill   EPINEPHrine  0.3 mg/0.3 mL IJ SOAJ injection Inject 0.3 mg into the muscle as needed for anaphylaxis. 2 each 1   loratadine  (CLARITIN ) 10 MG tablet Take 1 tablet (10 mg total) by mouth daily.       meloxicam  (MOBIC ) 15 MG tablet Take 0.5-1  tablets (7.5-15 mg total) by mouth daily. Take with food.  Don't take with ibuprofen  or aleve . 30 tablet 1      No current facility-administered medications for this visit.      Imaging Results (Last 48 hours)  No results found.      Review of Systems:   A ROS was performed including pertinent positives and negatives as documented in the HPI.   Physical Exam :   Constitutional: NAD and appears stated age Neurological: Alert and oriented Psych: Appropriate affect and cooperative There were no vitals taken for this visit.    Comprehensive Musculoskeletal Exam:     Tenderness in the posterior left elbow at the distal triceps insertion.  Active elbow range of motion is to 10 degrees extension and 130 degrees flexion.  Resisted elbow extension strength 4/5 compared to 5/5 on contralateral side.  Mild swelling in the posterior upper arm.   Imaging:   Xray (left humerus 2 views): Negative for bony abnormality   MRI left elbow: Full-thickness chondral lesion involving the capitellum I personally reviewed and interpreted the radiographs.     Assessment:   44 y.o. male with an acute injury to the back of his left arm suffered while performing chin ups.  At this time he does have a  full-thickness chondral lesion involving the capitellum which does measure approximately 1 cm.  Given this I do believe that he would be a candidate for elbow arthroscopy with debridement and microfracture with chondral pick.  I did discuss the associated recovery timeframe.  I did discuss that he has already trialed activity restriction with conservative management without any relief.  Given the fact that he does have a fairly large chondral lesion I do not believe that this would respond to physical therapy.  After discussion he is elected to proceed with surgical intervention Plan :     - Plan for left elbow arthroscopy with capitellum microfracture     After a lengthy discussion of treatment options, including risks, benefits, alternatives, complications of surgical and nonsurgical conservative options, the patient elected surgical repair.    The patient  is aware of the material risks  and complications including, but not limited to injury to adjacent structures, neurovascular injury, infection, numbness, bleeding, implant failure, thermal burns, stiffness, persistent pain, failure to heal, disease transmission from allograft, need for further surgery, dislocation, anesthetic risks, blood clots, risks of death,and others. The probabilities of surgical success and failure discussed with patient given their particular co-morbidities.The time and nature of expected rehabilitation and recovery was discussed.The patient's questions were all answered preoperatively.  No barriers to understanding were noted. I explained the natural history of the disease process and Rx rationale.  I explained to the patient what I considered to be reasonable expectations given their personal situation.  The final treatment plan was arrived at through a shared patient decision making process model.           I personally saw and evaluated the patient, and participated in the management and treatment plan.           Electronically signed by Genelle Standing, MD at 10/21/2023  4:57 PM

## 2023-11-24 NOTE — Anesthesia Procedure Notes (Signed)
 Procedure Name: LMA Insertion Date/Time: 11/24/2023 9:23 AM  Performed by: Debarah Chiquita LABOR, CRNAPre-anesthesia Checklist: Patient identified, Emergency Drugs available, Suction available and Patient being monitored Patient Re-evaluated:Patient Re-evaluated prior to induction Oxygen Delivery Method: Circle system utilized Preoxygenation: Pre-oxygenation with 100% oxygen Induction Type: IV induction Ventilation: Mask ventilation without difficulty LMA: LMA inserted LMA Size: 5.0 Number of attempts: 1 Airway Equipment and Method: Bite block Placement Confirmation: positive ETCO2 Tube secured with: Tape Dental Injury: Teeth and Oropharynx as per pre-operative assessment

## 2023-11-24 NOTE — Discharge Instructions (Addendum)
 Discharge Instructions    Attending Surgeon: Elspeth Parker, MD Office Phone Number: [phone]   Diagnosis and Procedures:    Surgeries Performed: Left elbow arthroscopy with capitellum microfracture  Discharge Plan:    Diet: Resume usual diet. Begin with light or bland foods.  Drink plenty of fluids.  Activity:  Range of motion as tolerated left elbow, advised against any heavy lifting more than 10 pounds You are advised to go home directly from the hospital or surgical center. Restrict your activities.  GENERAL INSTRUCTIONS: 1.  Keep your surgical site elevated above your heart for at least 5-7 days or longer to prevent swelling. This will improve your comfort and your overall recovery following surgery.     2. Please call Dr. Danetta office at [phone] with questions Monday-Friday during business hours. If no one answers, please leave a message and someone should get back to the patient within 24 hours. For emergencies please call 911 or proceed to the emergency room.   3. Patient to notify surgical team if experiences any of the following: Bowel/Bladder dysfunction, uncontrolled pain, nerve/muscle weakness, incision with increased drainage or redness, nausea/vomiting and Fever greater than 101.0 F.  Be alert for signs of infection including redness, streaking, odor, fever or chills. Be alert for excessive pain or bleeding and notify your surgeon immediately.  WOUND INSTRUCTIONS:   Leave your dressing/cast/splint in place until your post operative visit.  Keep it clean and dry.  Always keep the incision clean and dry until the staples/sutures are removed. If there is no drainage from the incision you should keep it open to air. If there is drainage from the incision you must keep it covered at all times until the drainage stops  Do not soak in a bath tub, hot tub, pool, lake or other body of water until 21 days after your surgery and your incision is completely  dry and healed.  If you have removable sutures (or staples) they must be removed 10-14 days (unless otherwise instructed) from the day of your surgery.     1)  Elevate the extremity as much as possible.  2)  Keep the dressing clean and dry.  3)  Please call us  if the dressing becomes wet or dirty.  4)  If you are experiencing worsening pain or worsening swelling, please call.     MEDICATIONS: Resume all previous home medications at the previous prescribed dose and frequency unless otherwise noted Start taking the  pain medications on an as-needed basis as prescribed  Please taper down pain medication over the next week following surgery.  Ideally you should not require a refill of any narcotic pain medication.  Take pain medication with food to minimize nausea. In addition to the prescribed pain medication, you may take over-the-counter pain relievers such as Tylenol .  Do NOT take additional tylenol  if your pain medication already has tylenol  in it.  Aspirin  325mg  daily per bottle instructions. Narcotic Policy: Per [facility] clinic policy, our goal is ensure optimal postoperative pain control with a multimodal pain management strategy. For all [facility] patients, our goal is to wean post-operative narcotic medications by 6 weeks post-operatively, and many times sooner. If this is not possible due to utilization of pain medication prior to surgery, your [facility] doctor will support your acute post-operative pain control for the first 6 weeks postoperatively, with a plan to transition you back to your primary pain team following that. Maralee will work to ensure a Therapist, occupational.  FOLLOWUP INSTRUCTIONS: 1. Follow up at the Physical Therapy Clinic 3-4 days following surgery. This appointment should be scheduled unless other arrangements have been made.The Physical Therapy scheduling number is [phone] if an appointment has not already been arranged.  2. Contact Dr. Danetta office  during office hours at [phone] or the practice after hours line at [phone] for non-emergencies. For medical emergencies call 911.   Discharge Location: Home   No Tylenol  before 1:30pm today.   Post Anesthesia Home Care Instructions  Activity: Get plenty of rest for the remainder of the day. A responsible individual must stay with you for 24 hours following the procedure.  For the next 24 hours, DO NOT: -Drive a car -Advertising copywriter -Drink alcoholic beverages -Take any medication unless instructed by your physician -Make any legal decisions or sign important papers.  Meals: Start with liquid foods such as gelatin or soup. Progress to regular foods as tolerated. Avoid greasy, spicy, heavy foods. If nausea and/or vomiting occur, drink only clear liquids until the nausea and/or vomiting subsides. Call your physician if vomiting continues.  Special Instructions/Symptoms: Your throat may feel dry or sore from the anesthesia or the breathing tube placed in your throat during surgery. If this causes discomfort, gargle with warm salt water. The discomfort should disappear within 24 hours.  If you had a scopolamine patch placed behind your ear for the management of post- operative nausea and/or vomiting:  1. The medication in the patch is effective for 72 hours, after which it should be removed.  Wrap patch in a tissue and discard in the trash. Wash hands thoroughly with soap and water. 2. You may remove the patch earlier than 72 hours if you experience unpleasant side effects which may include dry mouth, dizziness or visual disturbances. 3. Avoid touching the patch. Wash your hands with soap and water after contact with the patch.   Regional Anesthesia Blocks  1. You may not be able to move or feel the blocked extremity after a regional anesthetic block. This may last may last from 3-48 hours after placement, but it will go away. The length of time depends on the medication  injected and your individual response to the medication. As the nerves start to wake up, you may experience tingling as the movement and feeling returns to your extremity. If the numbness and inability to move your extremity has not gone away after 48 hours, please call your surgeon.   2. The extremity that is blocked will need to be protected until the numbness is gone and the strength has returned. Because you cannot feel it, you will need to take extra care to avoid injury. Because it may be weak, you may have difficulty moving it or using it. You may not know what position it is in without looking at it while the block is in effect.  3. For blocks in the legs and feet, returning to weight bearing and walking needs to be done carefully. You will need to wait until the numbness is entirely gone and the strength has returned. You should be able to move your leg and foot normally before you try and bear weight or walk. You will need someone to be with you when you first try to ensure you do not fall and possibly risk injury.  4. Bruising and tenderness at the needle site are common side effects and will resolve in a few days.  5. Persistent numbness or new problems with movement should be communicated to  the surgeon or the [facility] [phone] [facility] [phone]).

## 2023-11-24 NOTE — Progress Notes (Signed)
 Assisted Dr. Stephannie Peters with left, interscalene , ultrasound guided block. Side rails up, monitors on throughout procedure. See vital signs in flow sheet. Tolerated Procedure well.

## 2023-11-24 NOTE — Op Note (Signed)
   Date of Surgery: 11/24/2023  INDICATIONS: Tanner Diaz is a 43 y.o.-year-old male with left elbow capitellar chondral lesion.  The risk and benefits of the procedure were discussed in detail and documented in the pre-operative evaluation.   PREOPERATIVE DIAGNOSIS: 1.  Left capitellum chondral lesion, measuring 2 x 2 cm  POSTOPERATIVE DIAGNOSIS: Same.  PROCEDURE: 1.  Left elbow arthroscopy with capitellar lesion   SURGEON: Elspeth LITTIE Parker MD  ASSISTANT: Conley Dawson, ATC  ANESTHESIA:  general  IV FLUIDS AND URINE: See anesthesia record.  ANTIBIOTICS: Ancef   ESTIMATED BLOOD LOSS: 5 mL.  IMPLANTS:  * No implants in log *  DRAINS: None  CULTURES: None  COMPLICATIONS: none  DESCRIPTION OF PROCEDURE:   Patient was identified in the preoperative holding area.  The correct site was marked according to universal protocol.  Is subsequently taken back to the operating room.  He was prepped and draped in the lateral decubitus position.  Final timeout was again performed.  I began with diagnostic arthroscopy.  This revealed a 2 x 2 cm lesion involving the capitellum.  A direct posterior portal was used.  And an accessory soft portal was utilized for instrumentation.  Dannielle was introduced to debride arthrofibrosis and there was a loose body in the posterior olecranon fossa that was removed.  At this time the power pick was introduced and 5 symmetrically placed holes were put into the capitellar defect.  At this time the arthroscopy fluid was evacuated and the incisions were closed with 3-0 nylon and a dressing was placed with Xeroform Webril gauze Ace wrap.  All counts were correct at the end of the case there were no complications.  Sling the PACU.    POSTOPERATIVE PLAN: He will be weightbearing and activity as tolerated without any lifting.  He may begin early gentle range of motion.  I will plan to see him back in 2 weeks for reassessment.  He will be placed on aspirin  for blood clot  prevention  Elspeth LITTIE Parker, MD 11:04 AM

## 2023-11-24 NOTE — Anesthesia Postprocedure Evaluation (Signed)
 Anesthesia Post Note  Patient: Tanner Diaz  Procedure(s) Performed: ARTHROSCOPY, ELBOW (Left: Elbow)     Patient location during evaluation: PACU Anesthesia Type: Regional and MAC Level of consciousness: awake and alert Pain management: pain level controlled Vital Signs Assessment: post-procedure vital signs reviewed and stable Respiratory status: spontaneous breathing, nonlabored ventilation and respiratory function stable Cardiovascular status: blood pressure returned to baseline Postop Assessment: no apparent nausea or vomiting Anesthetic complications: no   No notable events documented.  Last Vitals:  Vitals:   11/24/23 1230 11/24/23 1255  BP: (!) 109/54 106/75  Pulse: 82 80  Resp: 15 20  Temp:  (!) 36.3 C  SpO2: 95% 93%    Last Pain:  Vitals:   11/24/23 1255  TempSrc: Temporal  PainSc: 0-No pain                 Vertell Row

## 2023-11-24 NOTE — Anesthesia Preprocedure Evaluation (Addendum)
 Anesthesia Evaluation  Patient identified by MRN, date of birth, ID band Patient awake    Reviewed: Allergy & Precautions, NPO status , Patient's Chart, lab work & pertinent test results  History of Anesthesia Complications Negative for: history of anesthetic complications  Airway Mallampati: II  TM Distance: >3 FB Neck ROM: Full    Dental no notable dental hx.    Pulmonary neg pulmonary ROS   Pulmonary exam normal        Cardiovascular negative cardio ROS Normal cardiovascular exam     Neuro/Psych negative neurological ROS     GI/Hepatic Neg liver ROS,GERD  Controlled,,  Endo/Other  negative endocrine ROS    Renal/GU negative Renal ROS     Musculoskeletal LEFT ELBOW CAPITELLUM CHONDRAL LESION   Abdominal   Peds  Hematology negative hematology ROS (+)   Anesthesia Other Findings Day of surgery medications reviewed with patient.  Reproductive/Obstetrics                              Anesthesia Physical Anesthesia Plan  ASA: 2  Anesthesia Plan: MAC and Regional   Post-op Pain Management: Tylenol  PO (pre-op)*   Induction:   PONV Risk Score and Plan: 1 and Treatment may vary due to age or medical condition, Propofol  infusion, Ondansetron  and Midazolam   Airway Management Planned: Natural Airway and Simple Face Mask  Additional Equipment: None  Intra-op Plan:   Post-operative Plan:   Informed Consent: I have reviewed the patients History and Physical, chart, labs and discussed the procedure including the risks, benefits and alternatives for the proposed anesthesia with the patient or authorized representative who has indicated his/her understanding and acceptance.       Plan Discussed with: CRNA  Anesthesia Plan Comments:          Anesthesia Quick Evaluation

## 2023-11-24 NOTE — Brief Op Note (Signed)
   Brief Op Note  Date of Surgery: 11/24/2023  Preoperative Diagnosis: LEFT ELBOW CAPITELLUM CHONDRAL LESION  Postoperative Diagnosis: same  Procedure: Procedure(s): ARTHROSCOPY, ELBOW  Implants: * No implants in log *  Surgeons: Surgeon(s): Genelle Standing, MD  Anesthesia: General    Estimated Blood Loss: See anesthesia record  Complications: None  Condition to PACU: Stable  Standing LITTIE Genelle, MD 11/24/2023 11:04 AM

## 2023-11-24 NOTE — Transfer of Care (Signed)
 Immediate Anesthesia Transfer of Care Note  Patient: Tanner Diaz  Procedure(s) Performed: ARTHROSCOPY, ELBOW (Left: Elbow)  Patient Location: PACU  Anesthesia Type:GA combined with regional for post-op pain  Level of Consciousness: drowsy and patient cooperative  Airway & Oxygen Therapy: Patient Spontanous Breathing and Patient connected to face mask oxygen  Post-op Assessment: Report given to RN and Post -op Vital signs reviewed and stable  Post vital signs: Reviewed and stable  Last Vitals:  Vitals Value Taken Time  BP 94/58 11/24/23 11:27  Temp 36.2 C 11/24/23 11:27  Pulse 75 11/24/23 11:28  Resp 14 11/24/23 11:28  SpO2 96 % 11/24/23 11:28  Vitals shown include unfiled device data.  Last Pain:  Vitals:   11/24/23 0723  TempSrc: Temporal  PainSc: 0-No pain         Complications: No notable events documented.

## 2023-11-24 NOTE — Anesthesia Procedure Notes (Signed)
 Anesthesia Regional Block: Supraclavicular block   Pre-Anesthetic Checklist: , timeout performed,  Correct Patient, Correct Site, Correct Laterality,  Correct Procedure, Correct Position, site marked,  Risks and benefits discussed,  Pre-op evaluation,  At surgeon's request and post-op pain management  Laterality: Left  Prep: Maximum Sterile Barrier Precautions used, chloraprep       Needles:  Injection technique: Single-shot  Needle Type: Echogenic Stimulator Needle     Needle Length: 9cm  Needle Gauge: 22     Additional Needles:   Procedures:,,,, ultrasound used (permanent image in chart),,    Narrative:  Start time: 11/24/2023 8:22 AM End time: 11/24/2023 8:25 AM Injection made incrementally with aspirations every 5 mL.  Performed by: Personally  Anesthesiologist: Paul Lamarr BRAVO, MD  Additional Notes: Risks, benefits, and alternative discussed. Patient gave consent for procedure. Patient prepped and draped in sterile fashion. Sedation administered, patient remains easily responsive to voice. Relevant anatomy identified with ultrasound guidance. Local anesthetic given in 5cc increments with no signs or symptoms of intravascular injection. No pain or paraesthesias with injection. Patient monitored throughout procedure with signs of LAST or immediate complications. Tolerated well. Ultrasound image placed in chart.  LANEY Paul, MD

## 2023-11-25 ENCOUNTER — Other Ambulatory Visit (HOSPITAL_BASED_OUTPATIENT_CLINIC_OR_DEPARTMENT_OTHER): Payer: Self-pay

## 2023-11-25 ENCOUNTER — Encounter (HOSPITAL_BASED_OUTPATIENT_CLINIC_OR_DEPARTMENT_OTHER): Payer: Self-pay | Admitting: Orthopaedic Surgery

## 2023-11-25 ENCOUNTER — Telehealth: Payer: Self-pay | Admitting: Orthopaedic Surgery

## 2023-11-25 ENCOUNTER — Other Ambulatory Visit (HOSPITAL_BASED_OUTPATIENT_CLINIC_OR_DEPARTMENT_OTHER): Payer: Self-pay | Admitting: Orthopaedic Surgery

## 2023-11-25 MED ORDER — METHOCARBAMOL 500 MG PO TABS
500.0000 mg | ORAL_TABLET | Freq: Four times a day (QID) | ORAL | 3 refills | Status: DC
  Filled 2023-11-25: qty 30, 8d supply, fill #0

## 2023-11-25 NOTE — Telephone Encounter (Signed)
 Patient called. Says he needs a RX for a muscle relaxer.

## 2023-11-27 ENCOUNTER — Other Ambulatory Visit: Payer: Self-pay

## 2023-11-27 ENCOUNTER — Ambulatory Visit (HOSPITAL_BASED_OUTPATIENT_CLINIC_OR_DEPARTMENT_OTHER): Attending: Orthopaedic Surgery | Admitting: Physical Therapy

## 2023-11-27 DIAGNOSIS — M25622 Stiffness of left elbow, not elsewhere classified: Secondary | ICD-10-CM | POA: Diagnosis not present

## 2023-11-27 DIAGNOSIS — M24122 Other articular cartilage disorders, left elbow: Secondary | ICD-10-CM | POA: Insufficient documentation

## 2023-11-27 DIAGNOSIS — M25522 Pain in left elbow: Secondary | ICD-10-CM | POA: Insufficient documentation

## 2023-11-27 NOTE — Therapy (Signed)
 OUTPATIENT PHYSICAL THERAPY UPPER EXTREMITY EVALUATION   Patient Name: Tanner Diaz MRN: [id number] DOB:03/21/1981, 43 y.o., male Today's Date: 11/27/2023  END OF SESSION:  PT End of Session - 11/27/23 1152     Visit Number 1    Number of Visits 16    Date for PT Re-Evaluation 01/22/24    PT Start Time 1150    PT Stop Time 1230    PT Time Calculation (min) 40 min    Activity Tolerance Patient tolerated treatment well    Behavior During Therapy WFL for tasks assessed/performed          Past Medical History:  Diagnosis Date   GERD (gastroesophageal reflux disease)    Past Surgical History:  Procedure Laterality Date   ELBOW ARTHROSCOPY Left 11/24/2023   Procedure: ARTHROSCOPY, ELBOW;  Surgeon: Genelle Standing, MD;  Location: [facility];  Service: Orthopedics;  Laterality: Left;  LEFT ELBOW ARTHROSCOPY WITH DEBRIDEMENT WITH MICROFRACTURE   WISDOM TOOTH EXTRACTION     Patient Active Problem List   Diagnosis Date Noted   Articular cartilage disorder of elbow, left 11/24/2023   Pain of upper extremity 10/04/2023   Dehydration 03/22/2023   Snoring 03/22/2023   Lip swelling 12/07/2022   Palpitations 12/23/2021   Strain of lumbar region 03/06/2021   Neck pain 09/22/2018   Foot pain 09/15/2017   Advance care planning 09/15/2014   Tinea versicolor 09/15/2014   Back pain 09/15/2014   Routine general medical examination at a health care facility 12/31/2011   GERD (gastroesophageal reflux disease) 03/26/2011    PCP: Dr Arlyss Solian MD  REFERRING PROVIDER: Dr Standing Genelle MD   REFERRING DIAG: Left Elbow Debridement   THERAPY DIAG:  Pain in left elbow - Plan: PT plan of care cert/re-cert  Stiffness of left elbow, not elsewhere classified - Plan: PT plan of care cert/re-cert  Rationale for Evaluation and Treatment: Rehabilitation  ONSET DATE: 11/24/2023  SUBJECTIVE:                                                                                                                                                                                       SUBJECTIVE STATEMENT: He reports  he was performing chin ups when he felt a pop in the back of his arm above the elbow. He has since been unable to fully extend the elbow and has had significant swelling in the area which has started to improve. He was found to have a cartilage defect in his elbow. On 11/24/2023 he had a elbow scope and clean up.  Hand dominance: Right  PERTINENT HISTORY: Nothing pertinent   PAIN:  Are you having  pain? Yes: NPRS scale: 1/10 became higher when he hit it on the way in here  Pain location: lateral elbow and inside the elbow  Pain description: aching  Aggravating factors: hitting the elbow  Relieving factors: comes and goes   PRECAUTIONS: None  RED FLAGS: None   WEIGHT BEARING RESTRICTIONS: Yes WBAT   FALLS:  Has patient fallen in last 6 months? No  LIVING ENVIRONMENT:  OCCUPATION: Works in [employer] Hobbies:  Likes to work out    PLOF: Independent  PATIENT GOALS:  To have use of his elbow  NEXT MD VISIT:  Next week.  OBJECTIVE:  Note: Objective measures were completed at Evaluation unless otherwise noted.  DIAGNOSTIC FINDINGS:  Nothing post op   PATIENT SURVEYS :  UEFS  Extreme difficulty/unable (0), Quite a bit of difficulty (1), Moderate difficulty (2), Little difficulty (3), No difficulty (4) Survey date:    Any of your usual work, household or school activities   2. Your usual hobbies, recreational/sport activities    3. Lifting a bag of groceries to waist level    4. Lifting a bag of groceries above your head   5. Grooming your hair   6. Pushing up on your hands (I.e. from bathtub or chair)   7. Preparing food (I.e. peeling/cutting)   8. Driving    9. Vacuuming, sweeping, or raking   10. Dressing    11. Doing up buttons   12. Using tools/appliances   13. Opening doors   14. Cleaning    15. Tying or lacing shoes   16.  Sleeping    17. Laundering clothes (I.e. washing, ironing, folding)   18. Opening a jar   19. Throwing a ball   20. Carrying a small suitcase with your affected limb.    Score total:  12/80     COGNITION: Overall cognitive status: Within functional limits for tasks assessed     SENSATION: ]block just wore off yesterday otherwise WNL   POSTURE:   UPPER EXTREMITY ROM:   MMT Right eval Left eval  Shoulder flexion    Shoulder extension    Shoulder abduction    Shoulder adduction    Shoulder internal rotation    Shoulder external rotation    Elbow flexion    Elbow extension    Wrist flexion    Wrist extension    Wrist ulnar deviation    Wrist radial deviation    Wrist pronation    Wrist supination    (Blank rows = not tested) Limited pronation/supination   UPPER EXTREMITY MMT:  PROM  Right eval Left eval  Shoulder flexion    Shoulder extension    Shoulder abduction    Shoulder adduction    Shoulder internal rotation    Shoulder external rotation    Middle trapezius    Lower trapezius    Elbow flexion  100  Elbow extension  -20 from straight   Wrist flexion    Wrist extension    Wrist ulnar deviation    Wrist radial deviation    Wrist pronation  8  Wrist supination  7  Grip strength (lbs)    (Blank rows = not tested)     PALPATION:  No unexpected TTP  TREATMENT DATE:  Bandage change- no s/s of infection; bandages removed, sutures in place, clean dry intact; tegaderm and fresh gauze replaced   Manual:  Extension ROM with biceps release  Review of self biceps release   There-ex:  PROM into Flexion/ Extension/ Pronation/ Supination   PATIENT EDUCATION: Education details: HEP, symptom management, progression of activity  Person educated: Patient Education method: Explanation, Demonstration, Tactile cues, Verbal cues, and  Handouts Education comprehension: verbalized understanding, returned demonstration, verbal cues required, tactile cues required, and needs further education  HOME EXERCISE PROGRAM: Access Code: [email or web address] URL: [email or web address] Date: 11/27/2023 Prepared by: Alm Don   ASSESSMENT:  CLINICAL IMPRESSION: Patient is a 43 year old male status post left elbow cartilage debridement and microfracture procedure performed on 11/24/2023.  At this time he presents with expected limitations in range of motion, strength, and general functional use of left upper extremity.  He would benefit from skilled therapy to return to work and return to active lifestyle.  OBJECTIVE IMPAIRMENTS: decreased activity tolerance, decreased ROM, decreased strength, impaired UE functional use, and pain.   ACTIVITY LIMITATIONS: carrying, lifting, bathing, dressing, reach over head, and hygiene/grooming  PARTICIPATION LIMITATIONS: meal prep, cleaning, laundry, driving, shopping, community activity, occupation, and yard work  PERSONAL FACTORS: None  REHAB POTENTIAL: Excellent  CLINICAL DECISION MAKING: Stable/uncomplicated  EVALUATION COMPLEXITY: Low  GOALS: Goals reviewed with patient? Yes  SHORT TERM GOALS: Target date: 12/25/2023    Patient will increase left elbow flexion to 140 degrees  Baseline: Goal status: INITIAL  2.  Patient will demonstrate full elbow extension on the left without pain  Baseline:  Goal status: INITIAL  3.  Patient will wean out of the sling  Baseline:  Goal status: INITIAL  4.  Patient will be independent with base HEP  Baseline:  Goal status: INITIAL   LONG TERM GOALS: Target date: 01/22/2024    Patient will use left UE for all ADL's without pain  Baseline:  Goal status: INITIAL  2.  Patient will return to work without pain in his elbow  Baseline:  Goal status: INITIAL  3.  Patient will use return to the gym without pain.  Baseline:  Goal  status: INITIAL    PLAN: PT FREQUENCY: 2x/week  PT DURATION: 8 weeks  PLANNED INTERVENTIONS: 97110-Therapeutic exercises, 97530- Therapeutic activity, [id number]- Neuromuscular re-education, 97535- Self Care, [id number]- Manual therapy, [id number]- Gait training, [phone]- Aquatic Therapy, 97014- Electrical stimulation (unattended), 97035- Ultrasound, Patient/Family education, Stair training, Taping, Dry Needling, DME instructions, Cryotherapy, and Moist heat   PLAN FOR NEXT SESSION:  Per MD patient had micro fx and clean up. He can progress as tolerated. Continue PROM. Consider self shoulder flexion. Add in wrist exercises. Progress to active motion as tolerated.    Alm JINNY Don, PT 11/27/2023, 4:31 PM

## 2023-11-30 ENCOUNTER — Other Ambulatory Visit (HOSPITAL_BASED_OUTPATIENT_CLINIC_OR_DEPARTMENT_OTHER): Payer: Self-pay

## 2023-12-01 ENCOUNTER — Encounter (HOSPITAL_BASED_OUTPATIENT_CLINIC_OR_DEPARTMENT_OTHER): Payer: Self-pay | Admitting: Orthopaedic Surgery

## 2023-12-03 ENCOUNTER — Encounter (HOSPITAL_BASED_OUTPATIENT_CLINIC_OR_DEPARTMENT_OTHER): Payer: Self-pay

## 2023-12-03 ENCOUNTER — Ambulatory Visit (HOSPITAL_BASED_OUTPATIENT_CLINIC_OR_DEPARTMENT_OTHER)

## 2023-12-03 DIAGNOSIS — M25522 Pain in left elbow: Secondary | ICD-10-CM | POA: Diagnosis not present

## 2023-12-03 DIAGNOSIS — M25622 Stiffness of left elbow, not elsewhere classified: Secondary | ICD-10-CM

## 2023-12-03 NOTE — Therapy (Signed)
 OUTPATIENT PHYSICAL THERAPY UPPER EXTREMITY TREATMENT   Patient Name: Tanner Diaz MRN: [id number] DOB:July 04, 1980, 43 y.o., male Today's Date: 12/03/2023  END OF SESSION:  PT End of Session - 12/03/23 1214     Visit Number 2    Number of Visits 16    Date for PT Re-Evaluation 01/22/24    PT Start Time 1018    PT Stop Time 1100    PT Time Calculation (min) 42 min    Activity Tolerance Patient tolerated treatment well    Behavior During Therapy WFL for tasks assessed/performed           Past Medical History:  Diagnosis Date   GERD (gastroesophageal reflux disease)    Past Surgical History:  Procedure Laterality Date   ELBOW ARTHROSCOPY Left 11/24/2023   Procedure: ARTHROSCOPY, ELBOW;  Surgeon: Genelle Standing, MD;  Location: [facility];  Service: Orthopedics;  Laterality: Left;  LEFT ELBOW ARTHROSCOPY WITH DEBRIDEMENT WITH MICROFRACTURE   WISDOM TOOTH EXTRACTION     Patient Active Problem List   Diagnosis Date Noted   Articular cartilage disorder of elbow, left 11/24/2023   Pain of upper extremity 10/04/2023   Dehydration 03/22/2023   Snoring 03/22/2023   Lip swelling 12/07/2022   Palpitations 12/23/2021   Strain of lumbar region 03/06/2021   Neck pain 09/22/2018   Foot pain 09/15/2017   Advance care planning 09/15/2014   Tinea versicolor 09/15/2014   Back pain 09/15/2014   Routine general medical examination at a health care facility 12/31/2011   GERD (gastroesophageal reflux disease) 03/26/2011    PCP: Dr Arlyss Solian MD  REFERRING PROVIDER: Dr Standing Genelle MD   REFERRING DIAG: Left Elbow Debridement   THERAPY DIAG:  Pain in left elbow  Stiffness of left elbow, not elsewhere classified  Rationale for Evaluation and Treatment: Rehabilitation  ONSET DATE: 11/24/2023  SUBJECTIVE:                                                                                                                                                                                       SUBJECTIVE STATEMENT: No pain at entry. Feels better overall. He has been icing it which has helped. Some tenderness still on outside of elbow.   Eval: He reports  he was performing chin ups when he felt a pop in the back of his arm above the elbow. He has since been unable to fully extend the elbow and has had significant swelling in the area which has started to improve. He was found to have a cartilage defect in his elbow. On 11/24/2023 he had a elbow scope and clean up.  Hand dominance: Right  PERTINENT HISTORY: Nothing pertinent   PAIN:  Are you having pain? Yes: NPRS scale: 0/10  Pain location: lateral elbow and inside the elbow  Pain description: aching  Aggravating factors: hitting the elbow  Relieving factors: comes and goes   PRECAUTIONS: None  RED FLAGS: None   WEIGHT BEARING RESTRICTIONS: Yes WBAT   FALLS:  Has patient fallen in last 6 months? No  LIVING ENVIRONMENT:  OCCUPATION: Works in [employer] Hobbies:  Likes to work out    PLOF: Independent  PATIENT GOALS:  To have use of his elbow  NEXT MD VISIT:  Next week.  OBJECTIVE:  Note: Objective measures were completed at Evaluation unless otherwise noted.  DIAGNOSTIC FINDINGS:  Nothing post op   PATIENT SURVEYS :  UEFS  Extreme difficulty/unable (0), Quite a bit of difficulty (1), Moderate difficulty (2), Little difficulty (3), No difficulty (4) Survey date:    Any of your usual work, household or school activities   2. Your usual hobbies, recreational/sport activities    3. Lifting a bag of groceries to waist level    4. Lifting a bag of groceries above your head   5. Grooming your hair   6. Pushing up on your hands (I.e. from bathtub or chair)   7. Preparing food (I.e. peeling/cutting)   8. Driving    9. Vacuuming, sweeping, or raking   10. Dressing    11. Doing up buttons   12. Using tools/appliances   13. Opening doors   14. Cleaning    15. Tying or lacing  shoes   16. Sleeping    17. Laundering clothes (I.e. washing, ironing, folding)   18. Opening a jar   19. Throwing a ball   20. Carrying a small suitcase with your affected limb.    Score total:  12/80     COGNITION: Overall cognitive status: Within functional limits for tasks assessed     SENSATION: ]block just wore off yesterday otherwise WNL   POSTURE:   UPPER EXTREMITY ROM:   MMT Right eval Left eval  Shoulder flexion    Shoulder extension    Shoulder abduction    Shoulder adduction    Shoulder internal rotation    Shoulder external rotation    Elbow flexion    Elbow extension    Wrist flexion    Wrist extension    Wrist ulnar deviation    Wrist radial deviation    Wrist pronation    Wrist supination    (Blank rows = not tested) Limited pronation/supination   UPPER EXTREMITY MMT:  PROM  Right eval Left eval  Shoulder flexion    Shoulder extension    Shoulder abduction    Shoulder adduction    Shoulder internal rotation    Shoulder external rotation    Middle trapezius    Lower trapezius    Elbow flexion  100  Elbow extension  -20 from straight   Wrist flexion    Wrist extension    Wrist ulnar deviation    Wrist radial deviation    Wrist pronation  8  Wrist supination  7  Grip strength (lbs)    (Blank rows = not tested)     PALPATION:  No unexpected TTP  TREATMENT DATE:   8/7: Dressing change PROM elbow (gentle) in supine with towel roll under arm PROM shoulder flexion Passive gentle pronation/supination  Eval: Bandage change- no s/s of infection; bandages removed, sutures in place, clean dry intact; tegaderm and fresh gauze replaced   Manual:  Extension ROM with biceps release  Review of self biceps release   There-ex:  PROM into Flexion/ Extension/ Pronation/ Supination   PATIENT EDUCATION: Education  details: HEP, symptom management, progression of activity  Person educated: Patient Education method: Explanation, Demonstration, Tactile cues, Verbal cues, and Handouts Education comprehension: verbalized understanding, returned demonstration, verbal cues required, tactile cues required, and needs further education  HOME EXERCISE PROGRAM: Access Code: [email or web address] URL: [email or web address] Date: 11/27/2023 Prepared by: Alm Don   ASSESSMENT:  CLINICAL IMPRESSION: Focused on gentle PROM today with excellent tolerance. Felt most relief with elbow extension passively and passive shoulder flexion. Pt did report having pain with pronation and supination in HEP so instructed him to perform decreased range with this or stop completely if painful. Changed dressing as pt still has 1 week before f/u. Incision sites are healing appropriately. Will continue to progress as tolerated.   Eval: Patient is a 43 year old male status post left elbow cartilage debridement and microfracture procedure performed on 11/24/2023.  At this time he presents with expected limitations in range of motion, strength, and general functional use of left upper extremity.  He would benefit from skilled therapy to return to work and return to active lifestyle.  OBJECTIVE IMPAIRMENTS: decreased activity tolerance, decreased ROM, decreased strength, impaired UE functional use, and pain.   ACTIVITY LIMITATIONS: carrying, lifting, bathing, dressing, reach over head, and hygiene/grooming  PARTICIPATION LIMITATIONS: meal prep, cleaning, laundry, driving, shopping, community activity, occupation, and yard work  PERSONAL FACTORS: None  REHAB POTENTIAL: Excellent  CLINICAL DECISION MAKING: Stable/uncomplicated  EVALUATION COMPLEXITY: Low  GOALS: Goals reviewed with patient? Yes  SHORT TERM GOALS: Target date: 12/25/2023    Patient will increase left elbow flexion to 140 degrees  Baseline: Goal status:  INITIAL  2.  Patient will demonstrate full elbow extension on the left without pain  Baseline:  Goal status: INITIAL  3.  Patient will wean out of the sling  Baseline:  Goal status: INITIAL  4.  Patient will be independent with base HEP  Baseline:  Goal status: INITIAL   LONG TERM GOALS: Target date: 01/22/2024    Patient will use left UE for all ADL's without pain  Baseline:  Goal status: INITIAL  2.  Patient will return to work without pain in his elbow  Baseline:  Goal status: INITIAL  3.  Patient will use return to the gym without pain.  Baseline:  Goal status: INITIAL    PLAN: PT FREQUENCY: 2x/week  PT DURATION: 8 weeks  PLANNED INTERVENTIONS: 97110-Therapeutic exercises, 97530- Therapeutic activity, [id number]- Neuromuscular re-education, 97535- Self Care, [id number]- Manual therapy, [id number]- Gait training, [phone]- Aquatic Therapy, [id number]- Electrical stimulation (unattended), 97035- Ultrasound, Patient/Family education, Stair training, Taping, Dry Needling, DME instructions, Cryotherapy, and Moist heat   PLAN FOR NEXT SESSION:  Per MD patient had micro fx and clean up. He can progress as tolerated. Continue PROM. Consider self shoulder flexion. Add in wrist exercises. Progress to active motion as tolerated.    Asberry BRAVO [place] Goeken, PTA 12/03/2023, 12:19 PM

## 2023-12-09 ENCOUNTER — Ambulatory Visit (HOSPITAL_BASED_OUTPATIENT_CLINIC_OR_DEPARTMENT_OTHER): Admitting: Physical Therapy

## 2023-12-09 ENCOUNTER — Ambulatory Visit (HOSPITAL_BASED_OUTPATIENT_CLINIC_OR_DEPARTMENT_OTHER): Admitting: Orthopaedic Surgery

## 2023-12-09 ENCOUNTER — Encounter (HOSPITAL_BASED_OUTPATIENT_CLINIC_OR_DEPARTMENT_OTHER): Payer: Self-pay | Admitting: Physical Therapy

## 2023-12-09 DIAGNOSIS — M25522 Pain in left elbow: Secondary | ICD-10-CM

## 2023-12-09 DIAGNOSIS — M25622 Stiffness of left elbow, not elsewhere classified: Secondary | ICD-10-CM

## 2023-12-09 DIAGNOSIS — M24122 Other articular cartilage disorders, left elbow: Secondary | ICD-10-CM

## 2023-12-09 NOTE — Therapy (Signed)
 OUTPATIENT PHYSICAL THERAPY UPPER EXTREMITY TREATMENT   Patient Name: Tanner Diaz MRN: [id number] DOB:August 03, 1980, 43 y.o., male Today's Date: 12/09/2023  END OF SESSION:  PT End of Session - 12/09/23 1203     Visit Number 3    Number of Visits 16    Date for PT Re-Evaluation 01/22/24    PT Start Time 1145    PT Stop Time 1228    PT Time Calculation (min) 43 min    Activity Tolerance Patient tolerated treatment well    Behavior During Therapy WFL for tasks assessed/performed           Past Medical History:  Diagnosis Date   GERD (gastroesophageal reflux disease)    Past Surgical History:  Procedure Laterality Date   ELBOW ARTHROSCOPY Left 11/24/2023   Procedure: ARTHROSCOPY, ELBOW;  Surgeon: Genelle Standing, MD;  Location: [facility];  Service: Orthopedics;  Laterality: Left;  LEFT ELBOW ARTHROSCOPY WITH DEBRIDEMENT WITH MICROFRACTURE   WISDOM TOOTH EXTRACTION     Patient Active Problem List   Diagnosis Date Noted   Articular cartilage disorder of elbow, left 11/24/2023   Pain of upper extremity 10/04/2023   Dehydration 03/22/2023   Snoring 03/22/2023   Lip swelling 12/07/2022   Palpitations 12/23/2021   Strain of lumbar region 03/06/2021   Neck pain 09/22/2018   Foot pain 09/15/2017   Advance care planning 09/15/2014   Tinea versicolor 09/15/2014   Back pain 09/15/2014   Routine general medical examination at a health care facility 12/31/2011   GERD (gastroesophageal reflux disease) 03/26/2011    PCP: Dr Arlyss Solian MD  REFERRING PROVIDER: Dr Standing Genelle MD   REFERRING DIAG: Left Elbow Debridement   THERAPY DIAG:  Pain in left elbow  Stiffness of left elbow, not elsewhere classified  Rationale for Evaluation and Treatment: Rehabilitation  ONSET DATE: 11/24/2023  SUBJECTIVE:                                                                                                                                                                                       SUBJECTIVE STATEMENT: Patient reports it is doing better. He has had mild pain on both sides of he's elbow but otherwise he is doing well.   Eval: He reports  he was performing chin ups when he felt a pop in the back of his arm above the elbow. He has since been unable to fully extend the elbow and has had significant swelling in the area which has started to improve. He was found to have a cartilage defect in his elbow. On 11/24/2023 he had a elbow scope and clean up.  Hand dominance:  Right  PERTINENT HISTORY: Nothing pertinent   PAIN:  Are you having pain? Yes: NPRS scale: 0/10  Pain location: lateral elbow and inside the elbow  Pain description: aching  Aggravating factors: hitting the elbow  Relieving factors: comes and goes   PRECAUTIONS: None  RED FLAGS: None   WEIGHT BEARING RESTRICTIONS: Yes WBAT   FALLS:  Has patient fallen in last 6 months? No  LIVING ENVIRONMENT:  OCCUPATION: Works in [employer] Hobbies:  Likes to work out    PLOF: Independent  PATIENT GOALS:  To have use of his elbow  NEXT MD VISIT:  Next week.  OBJECTIVE:  Note: Objective measures were completed at Evaluation unless otherwise noted.  DIAGNOSTIC FINDINGS:  Nothing post op   PATIENT SURVEYS :  UEFS  Extreme difficulty/unable (0), Quite a bit of difficulty (1), Moderate difficulty (2), Little difficulty (3), No difficulty (4) Survey date:    Any of your usual work, household or school activities   2. Your usual hobbies, recreational/sport activities    3. Lifting a bag of groceries to waist level    4. Lifting a bag of groceries above your head   5. Grooming your hair   6. Pushing up on your hands (I.e. from bathtub or chair)   7. Preparing food (I.e. peeling/cutting)   8. Driving    9. Vacuuming, sweeping, or raking   10. Dressing    11. Doing up buttons   12. Using tools/appliances   13. Opening doors   14. Cleaning    15. Tying or lacing shoes    16. Sleeping    17. Laundering clothes (I.e. washing, ironing, folding)   18. Opening a jar   19. Throwing a ball   20. Carrying a small suitcase with your affected limb.    Score total:  12/80     COGNITION: Overall cognitive status: Within functional limits for tasks assessed     SENSATION: ]block just wore off yesterday otherwise WNL   POSTURE:   UPPER EXTREMITY ROM:   MMT Right eval Left eval  Shoulder flexion    Shoulder extension    Shoulder abduction    Shoulder adduction    Shoulder internal rotation    Shoulder external rotation    Elbow flexion    Elbow extension    Wrist flexion    Wrist extension    Wrist ulnar deviation    Wrist radial deviation    Wrist pronation    Wrist supination    (Blank rows = not tested) Limited pronation/supination   UPPER EXTREMITY MMT:  PROM  Right eval Left eval  Shoulder flexion    Shoulder extension    Shoulder abduction    Shoulder adduction    Shoulder internal rotation    Shoulder external rotation    Middle trapezius    Lower trapezius    Elbow flexion  100  Elbow extension  -20 from straight   Wrist flexion    Wrist extension    Wrist ulnar deviation    Wrist radial deviation    Wrist pronation  8  Wrist supination  7  Grip strength (lbs)    (Blank rows = not tested)     PALPATION:  No unexpected TTP  TREATMENT DATE:  8/14 Manual:  Extension ROM with biceps release  Review of self biceps release  Biceps flexion with distraction Radial head mobilization with supination/pronation stretching   There-ex:  Active elbow flexion 2x10 low RPE added 1lb no pain x10  Triceps extension 3x10  Shoulder flexion 3x10  Supination pronation 3x10 1 lb added after the first set    8/7: Dressing change PROM elbow (gentle) in supine with towel roll under arm PROM shoulder  flexion Passive gentle pronation/supination  Eval: Bandage change- no s/s of infection; bandages removed, sutures in place, clean dry intact; tegaderm and fresh gauze replaced   Manual:  Extension ROM with biceps release  Review of self biceps release   There-ex:  PROM into Flexion/ Extension/ Pronation/ Supination   PATIENT EDUCATION: Education details: HEP, symptom management, progression of activity  Person educated: Patient Education method: Explanation, Demonstration, Tactile cues, Verbal cues, and Handouts Education comprehension: verbalized understanding, returned demonstration, verbal cues required, tactile cues required, and needs further education  HOME EXERCISE PROGRAM: Access Code: [email or web address] URL: [email or web address] Date: 11/27/2023 Prepared by: Alm Don   ASSESSMENT:  CLINICAL IMPRESSION: The patients ROM is improving significantly. He had mild pain with end range flexion. His extension was full after manual therapy. We added in light active ROM and loaded using RPE. Overall he is progressing well. We will continue to progress him using RPE to guide. We reviewed how to use a towel for self elbow distraction.  Eval: Patient is a 42 year old male status post left elbow cartilage debridement and microfracture procedure performed on 11/24/2023.  At this time he presents with expected limitations in range of motion, strength, and general functional use of left upper extremity.  He would benefit from skilled therapy to return to work and return to active lifestyle.  OBJECTIVE IMPAIRMENTS: decreased activity tolerance, decreased ROM, decreased strength, impaired UE functional use, and pain.   ACTIVITY LIMITATIONS: carrying, lifting, bathing, dressing, reach over head, and hygiene/grooming  PARTICIPATION LIMITATIONS: meal prep, cleaning, laundry, driving, shopping, community activity, occupation, and yard work  PERSONAL FACTORS: None  REHAB POTENTIAL:  Excellent  CLINICAL DECISION MAKING: Stable/uncomplicated  EVALUATION COMPLEXITY: Low  GOALS: Goals reviewed with patient? Yes  SHORT TERM GOALS: Target date: 12/25/2023    Patient will increase left elbow flexion to 140 degrees  Baseline: Goal status: INITIAL  2.  Patient will demonstrate full elbow extension on the left without pain  Baseline:  Goal status: INITIAL  3.  Patient will wean out of the sling  Baseline:  Goal status: INITIAL  4.  Patient will be independent with base HEP  Baseline:  Goal status: INITIAL   LONG TERM GOALS: Target date: 01/22/2024    Patient will use left UE for all ADL's without pain  Baseline:  Goal status: INITIAL  2.  Patient will return to work without pain in his elbow  Baseline:  Goal status: INITIAL  3.  Patient will use return to the gym without pain.  Baseline:  Goal status: INITIAL    PLAN: PT FREQUENCY: 2x/week  PT DURATION: 8 weeks  PLANNED INTERVENTIONS: 97110-Therapeutic exercises, 97530- Therapeutic activity, [id number]- Neuromuscular re-education, 97535- Self Care, [id number]- Manual therapy, [id number]- Gait training, [phone]- Aquatic Therapy, [id number]- Electrical stimulation (unattended), 97035- Ultrasound, Patient/Family education, Stair training, Taping, Dry Needling, DME instructions, Cryotherapy, and Moist heat   PLAN FOR NEXT SESSION:  Per MD patient had micro fx and clean up. He can progress as tolerated. Continue PROM. Consider self shoulder flexion. Add in  wrist exercises. Progress to active motion as tolerated.    Alm JINNY Don, PT 12/09/2023, 12:19 PM

## 2023-12-09 NOTE — Progress Notes (Signed)
 Post Operative Evaluation    Procedure/Date of Surgery: Left elbow microfracture with debridement 7/29  Interval History:   Presents today status post the above procedure.  Overall he is doing well.  He does have some occasional soreness with full extension although this is improving with therapy   PMH/PSH/Family History/Social History/Meds/Allergies:    Past Medical History:  Diagnosis Date   GERD (gastroesophageal reflux disease)    Past Surgical History:  Procedure Laterality Date   ELBOW ARTHROSCOPY Left 11/24/2023   Procedure: ARTHROSCOPY, ELBOW;  Surgeon: Genelle Standing, MD;  Location: [facility];  Service: Orthopedics;  Laterality: Left;  LEFT ELBOW ARTHROSCOPY WITH DEBRIDEMENT WITH MICROFRACTURE   WISDOM TOOTH EXTRACTION     Social History   Socioeconomic History   Marital status: Married    Spouse name: Not on file   Number of children: Not on file   Years of education: Not on file   Highest education level: Not on file  Occupational History   Occupation: Civil engineer, contracting: [employer]  Tobacco Use   Smoking status: Never    Passive exposure: Past   Smokeless tobacco: Never  Vaping Use   Vaping status: Never Used  Substance and Sexual Activity   Alcohol use: No    Alcohol/week: 0.0 standard drinks of alcohol   Drug use: No   Sexual activity: Not on file  Other Topics Concern   Not on file  Social History Narrative   [employer]   [employer] fan   Married 2004   4 daughters   Likes to hunt   Social Drivers of Corporate investment banker Strain: Not on file  Food Insecurity: Not on file  Transportation Needs: Not on file  Physical Activity: Not on file  Stress: Not on file  Social Connections: Not on file   Family History  Problem Relation Age of Onset   Stroke Father    Diabetes Father    Diabetes Other    Colon cancer Neg Hx    Prostate cancer Neg Hx    Allergies   Allergen Reactions   Milk-Related Compounds Other (See Comments)    GI upset   Current Outpatient Medications  Medication Sig Dispense Refill   methocarbamol  (ROBAXIN ) 500 MG tablet Take 1 tablet (500 mg total) by mouth 4 (four) times daily. 30 tablet 3   aspirin  EC 325 MG tablet Take 1 tablet (325 mg total) by mouth daily. 14 tablet 0   EPINEPHrine  0.3 mg/0.3 mL IJ SOAJ injection Inject 0.3 mg into the muscle as needed for anaphylaxis. 2 each 1   ketoconazole  (NIZORAL ) 2 % shampoo Apply 1 Application topically as directed. Wash trunk daily until clear then wash weekly for prevention. Let sit a few minutes then rinse. 120 mL 6   loratadine  (CLARITIN ) 10 MG tablet Take 1 tablet (10 mg total) by mouth daily.     meloxicam  (MOBIC ) 15 MG tablet Take 0.5-1 tablets (7.5-15 mg total) by mouth daily. Take with food.  Don't take with ibuprofen  or aleve . (Patient not taking: Reported on 11/17/2023) 30 tablet 1   oxyCODONE  (ROXICODONE ) 5 MG immediate release tablet Take 1 tablet (5 mg total) by mouth every 4 (four) hours as needed for severe pain (pain score 7-10) or breakthrough pain. 10  tablet 0   No current facility-administered medications for this visit.   No results found.  Review of Systems:   A ROS was performed including pertinent positives and negatives as documented in the HPI.   Musculoskeletal Exam:    There were no vitals taken for this visit.  Left elbow incisions are well-appearing without erythema or drainage.  There is some weakness with triceps extension.  Otherwise distal neurosensory exam is intact  Imaging:      I personally reviewed and interpreted the radiographs.   Assessment:   2 weeks status post left elbow microfracture and debridement doing well.  At this time he will continue to progress the range of motion and strengthening.  He will remain out of work for an additional 4 weeks.  I will plan to see him back in 4 weeks for reassessment  Plan :    - Return  to clinic 4 weeks for reassessment      I personally saw and evaluated the patient, and participated in the management and treatment plan.  Elspeth Parker, MD Attending Physician, Orthopedic Surgery  This document was dictated using Dragon voice recognition software. A reasonable attempt at proof reading has been made to minimize errors.

## 2023-12-09 NOTE — Therapy (Deleted)
 OUTPATIENT PHYSICAL THERAPY UPPER EXTREMITY TREATMENT   Patient Name: Tanner Diaz MRN: [id number] DOB:Apr 20, 1981, 43 y.o., male Today's Date: 12/09/2023  END OF SESSION:  PT End of Session - 12/09/23 1203     Visit Number 3    Number of Visits 16    Date for PT Re-Evaluation 01/22/24    PT Start Time 1145    PT Stop Time 1228    PT Time Calculation (min) 43 min    Activity Tolerance Patient tolerated treatment well    Behavior During Therapy WFL for tasks assessed/performed           Past Medical History:  Diagnosis Date   GERD (gastroesophageal reflux disease)    Past Surgical History:  Procedure Laterality Date   ELBOW ARTHROSCOPY Left 11/24/2023   Procedure: ARTHROSCOPY, ELBOW;  Surgeon: Genelle Standing, MD;  Location: [facility];  Service: Orthopedics;  Laterality: Left;  LEFT ELBOW ARTHROSCOPY WITH DEBRIDEMENT WITH MICROFRACTURE   WISDOM TOOTH EXTRACTION     Patient Active Problem List   Diagnosis Date Noted   Articular cartilage disorder of elbow, left 11/24/2023   Pain of upper extremity 10/04/2023   Dehydration 03/22/2023   Snoring 03/22/2023   Lip swelling 12/07/2022   Palpitations 12/23/2021   Strain of lumbar region 03/06/2021   Neck pain 09/22/2018   Foot pain 09/15/2017   Advance care planning 09/15/2014   Tinea versicolor 09/15/2014   Back pain 09/15/2014   Routine general medical examination at a health care facility 12/31/2011   GERD (gastroesophageal reflux disease) 03/26/2011    PCP: Dr Arlyss Solian MD  REFERRING PROVIDER: Dr Standing Genelle MD   REFERRING DIAG: Left Elbow Debridement   THERAPY DIAG:  Pain in left elbow  Stiffness of left elbow, not elsewhere classified  Rationale for Evaluation and Treatment: Rehabilitation  ONSET DATE: 11/24/2023  SUBJECTIVE:                                                                                                                                                                                       SUBJECTIVE STATEMENT: No pain at entry. Feels better overall. He has been icing it which has helped. Some tenderness still on outside of elbow.   Eval: He reports  he was performing chin ups when he felt a pop in the back of his arm above the elbow. He has since been unable to fully extend the elbow and has had significant swelling in the area which has started to improve. He was found to have a cartilage defect in his elbow. On 11/24/2023 he had a elbow scope and clean up.  Hand dominance: Right  PERTINENT HISTORY: Nothing pertinent   PAIN:  Are you having pain? Yes: NPRS scale: 0/10  Pain location: lateral elbow and inside the elbow  Pain description: aching  Aggravating factors: hitting the elbow  Relieving factors: comes and goes   PRECAUTIONS: None  RED FLAGS: None   WEIGHT BEARING RESTRICTIONS: Yes WBAT   FALLS:  Has patient fallen in last 6 months? No  LIVING ENVIRONMENT:  OCCUPATION: Works in [employer] Hobbies:  Likes to work out    PLOF: Independent  PATIENT GOALS:  To have use of his elbow  NEXT MD VISIT:  Next week.  OBJECTIVE:  Note: Objective measures were completed at Evaluation unless otherwise noted.  DIAGNOSTIC FINDINGS:  Nothing post op   PATIENT SURVEYS :  UEFS  Extreme difficulty/unable (0), Quite a bit of difficulty (1), Moderate difficulty (2), Little difficulty (3), No difficulty (4) Survey date:    Any of your usual work, household or school activities   2. Your usual hobbies, recreational/sport activities    3. Lifting a bag of groceries to waist level    4. Lifting a bag of groceries above your head   5. Grooming your hair   6. Pushing up on your hands (I.e. from bathtub or chair)   7. Preparing food (I.e. peeling/cutting)   8. Driving    9. Vacuuming, sweeping, or raking   10. Dressing    11. Doing up buttons   12. Using tools/appliances   13. Opening doors   14. Cleaning    15. Tying or lacing  shoes   16. Sleeping    17. Laundering clothes (I.e. washing, ironing, folding)   18. Opening a jar   19. Throwing a ball   20. Carrying a small suitcase with your affected limb.    Score total:  12/80     COGNITION: Overall cognitive status: Within functional limits for tasks assessed     SENSATION: ]block just wore off yesterday otherwise WNL   POSTURE:   UPPER EXTREMITY ROM:   MMT Right eval Left eval  Shoulder flexion    Shoulder extension    Shoulder abduction    Shoulder adduction    Shoulder internal rotation    Shoulder external rotation    Elbow flexion    Elbow extension    Wrist flexion    Wrist extension    Wrist ulnar deviation    Wrist radial deviation    Wrist pronation    Wrist supination    (Blank rows = not tested) Limited pronation/supination   UPPER EXTREMITY MMT:  PROM  Right eval Left eval  Shoulder flexion    Shoulder extension    Shoulder abduction    Shoulder adduction    Shoulder internal rotation    Shoulder external rotation    Middle trapezius    Lower trapezius    Elbow flexion  100  Elbow extension  -20 from straight   Wrist flexion    Wrist extension    Wrist ulnar deviation    Wrist radial deviation    Wrist pronation  8  Wrist supination  7  Grip strength (lbs)    (Blank rows = not tested)     PALPATION:  No unexpected TTP  TREATMENT DATE:   8/7: Dressing change PROM elbow (gentle) in supine with towel roll under arm PROM shoulder flexion Passive gentle pronation/supination  Eval: Bandage change- no s/s of infection; bandages removed, sutures in place, clean dry intact; tegaderm and fresh gauze replaced   Manual:  Extension ROM with biceps release  Review of self biceps release   There-ex:  PROM into Flexion/ Extension/ Pronation/ Supination   PATIENT EDUCATION: Education  details: HEP, symptom management, progression of activity  Person educated: Patient Education method: Explanation, Demonstration, Tactile cues, Verbal cues, and Handouts Education comprehension: verbalized understanding, returned demonstration, verbal cues required, tactile cues required, and needs further education  HOME EXERCISE PROGRAM: Access Code: [email or web address] URL: [email or web address] Date: 11/27/2023 Prepared by: Alm Don   ASSESSMENT:  CLINICAL IMPRESSION: Focused on gentle PROM today with excellent tolerance. Felt most relief with elbow extension passively and passive shoulder flexion. Pt did report having pain with pronation and supination in HEP so instructed him to perform decreased range with this or stop completely if painful. Changed dressing as pt still has 1 week before f/u. Incision sites are healing appropriately. Will continue to progress as tolerated.   Eval: Patient is a 43 year old male status post left elbow cartilage debridement and microfracture procedure performed on 11/24/2023.  At this time he presents with expected limitations in range of motion, strength, and general functional use of left upper extremity.  He would benefit from skilled therapy to return to work and return to active lifestyle.  OBJECTIVE IMPAIRMENTS: decreased activity tolerance, decreased ROM, decreased strength, impaired UE functional use, and pain.   ACTIVITY LIMITATIONS: carrying, lifting, bathing, dressing, reach over head, and hygiene/grooming  PARTICIPATION LIMITATIONS: meal prep, cleaning, laundry, driving, shopping, community activity, occupation, and yard work  PERSONAL FACTORS: None  REHAB POTENTIAL: Excellent  CLINICAL DECISION MAKING: Stable/uncomplicated  EVALUATION COMPLEXITY: Low  GOALS: Goals reviewed with patient? Yes  SHORT TERM GOALS: Target date: 12/25/2023    Patient will increase left elbow flexion to 140 degrees  Baseline: Goal status:  INITIAL  2.  Patient will demonstrate full elbow extension on the left without pain  Baseline:  Goal status: INITIAL  3.  Patient will wean out of the sling  Baseline:  Goal status: INITIAL  4.  Patient will be independent with base HEP  Baseline:  Goal status: INITIAL   LONG TERM GOALS: Target date: 01/22/2024    Patient will use left UE for all ADL's without pain  Baseline:  Goal status: INITIAL  2.  Patient will return to work without pain in his elbow  Baseline:  Goal status: INITIAL  3.  Patient will use return to the gym without pain.  Baseline:  Goal status: INITIAL    PLAN: PT FREQUENCY: 2x/week  PT DURATION: 8 weeks  PLANNED INTERVENTIONS: 97110-Therapeutic exercises, 97530- Therapeutic activity, [id number]- Neuromuscular re-education, 97535- Self Care, 02859- Manual therapy, [id number]- Gait training, [phone]- Aquatic Therapy, [id number]- Electrical stimulation (unattended), 97035- Ultrasound, Patient/Family education, Stair training, Taping, Dry Needling, DME instructions, Cryotherapy, and Moist heat   PLAN FOR NEXT SESSION:  Per MD patient had micro fx and clean up. He can progress as tolerated. Continue PROM. Consider self shoulder flexion. Add in wrist exercises. Progress to active motion as tolerated.    Alm JINNY Don, PT 12/09/2023, 1:42 PM

## 2023-12-10 ENCOUNTER — Encounter (HOSPITAL_BASED_OUTPATIENT_CLINIC_OR_DEPARTMENT_OTHER): Payer: Self-pay | Admitting: Physical Therapy

## 2023-12-17 ENCOUNTER — Ambulatory Visit (HOSPITAL_BASED_OUTPATIENT_CLINIC_OR_DEPARTMENT_OTHER): Admitting: Physical Therapy

## 2023-12-17 DIAGNOSIS — M25622 Stiffness of left elbow, not elsewhere classified: Secondary | ICD-10-CM

## 2023-12-17 DIAGNOSIS — M25522 Pain in left elbow: Secondary | ICD-10-CM

## 2023-12-17 NOTE — Therapy (Signed)
 OUTPATIENT PHYSICAL THERAPY UPPER EXTREMITY TREATMENT   Patient Name: Tanner Diaz MRN: [id number] DOB:July 16, 1980, 43 y.o., male Today's Date: 12/18/2023  END OF SESSION:  PT End of Session - 12/18/23 1216     Visit Number 4    Number of Visits 16    Date for PT Re-Evaluation 01/22/24    PT Start Time 1230    PT Stop Time 1310    PT Time Calculation (min) 40 min    Activity Tolerance Patient tolerated treatment well    Behavior During Therapy WFL for tasks assessed/performed            Past Medical History:  Diagnosis Date   GERD (gastroesophageal reflux disease)    Past Surgical History:  Procedure Laterality Date   ELBOW ARTHROSCOPY Left 11/24/2023   Procedure: ARTHROSCOPY, ELBOW;  Surgeon: Genelle Standing, MD;  Location: [facility];  Service: Orthopedics;  Laterality: Left;  LEFT ELBOW ARTHROSCOPY WITH DEBRIDEMENT WITH MICROFRACTURE   WISDOM TOOTH EXTRACTION     Patient Active Problem List   Diagnosis Date Noted   Articular cartilage disorder of elbow, left 11/24/2023   Pain of upper extremity 10/04/2023   Dehydration 03/22/2023   Snoring 03/22/2023   Lip swelling 12/07/2022   Palpitations 12/23/2021   Strain of lumbar region 03/06/2021   Neck pain 09/22/2018   Foot pain 09/15/2017   Advance care planning 09/15/2014   Tinea versicolor 09/15/2014   Back pain 09/15/2014   Routine general medical examination at a health care facility 12/31/2011   GERD (gastroesophageal reflux disease) 03/26/2011    PCP: Dr Arlyss Solian MD  REFERRING PROVIDER: Dr Standing Genelle MD   REFERRING DIAG: Left Elbow Debridement   THERAPY DIAG:  Pain in left elbow  Stiffness of left elbow, not elsewhere classified  Rationale for Evaluation and Treatment: Rehabilitation  ONSET DATE: 11/24/2023  SUBJECTIVE:                                                                                                                                                                                       SUBJECTIVE STATEMENT: Patient reports it is doing better. He has had mild pain on both sides of he's elbow but otherwise he is doing well.   Eval: He reports  he was performing chin ups when he felt a pop in the back of his arm above the elbow. He has since been unable to fully extend the elbow and has had significant swelling in the area which has started to improve. He was found to have a cartilage defect in his elbow. On 11/24/2023 he had a elbow scope and clean up.  Hand  dominance: Right  PERTINENT HISTORY: Nothing pertinent   PAIN:  Are you having pain? Yes: NPRS scale: 0/10  Pain location: lateral elbow and inside the elbow  Pain description: aching  Aggravating factors: hitting the elbow  Relieving factors: comes and goes   PRECAUTIONS: None  RED FLAGS: None   WEIGHT BEARING RESTRICTIONS: Yes WBAT   FALLS:  Has patient fallen in last 6 months? No  LIVING ENVIRONMENT:  OCCUPATION: Works in [employer] Hobbies:  Likes to work out    PLOF: Independent  PATIENT GOALS:  To have use of his elbow  NEXT MD VISIT:  Next week.  OBJECTIVE:  Note: Objective measures were completed at Evaluation unless otherwise noted.  DIAGNOSTIC FINDINGS:  Nothing post op   PATIENT SURVEYS :  UEFS  Extreme difficulty/unable (0), Quite a bit of difficulty (1), Moderate difficulty (2), Little difficulty (3), No difficulty (4) Survey date:    Any of your usual work, household or school activities   2. Your usual hobbies, recreational/sport activities    3. Lifting a bag of groceries to waist level    4. Lifting a bag of groceries above your head   5. Grooming your hair   6. Pushing up on your hands (I.e. from bathtub or chair)   7. Preparing food (I.e. peeling/cutting)   8. Driving    9. Vacuuming, sweeping, or raking   10. Dressing    11. Doing up buttons   12. Using tools/appliances   13. Opening doors   14. Cleaning    15. Tying or lacing  shoes   16. Sleeping    17. Laundering clothes (I.e. washing, ironing, folding)   18. Opening a jar   19. Throwing a ball   20. Carrying a small suitcase with your affected limb.    Score total:  12/80     COGNITION: Overall cognitive status: Within functional limits for tasks assessed     SENSATION: ]block just wore off yesterday otherwise WNL   POSTURE:   UPPER EXTREMITY ROM:   MMT Right eval Left eval  Shoulder flexion    Shoulder extension    Shoulder abduction    Shoulder adduction    Shoulder internal rotation    Shoulder external rotation    Elbow flexion    Elbow extension    Wrist flexion    Wrist extension    Wrist ulnar deviation    Wrist radial deviation    Wrist pronation    Wrist supination    (Blank rows = not tested) Limited pronation/supination   UPPER EXTREMITY MMT:  PROM  Right eval Left eval  Shoulder flexion    Shoulder extension    Shoulder abduction    Shoulder adduction    Shoulder internal rotation    Shoulder external rotation    Middle trapezius    Lower trapezius    Elbow flexion  100  Elbow extension  -20 from straight   Wrist flexion    Wrist extension    Wrist ulnar deviation    Wrist radial deviation    Wrist pronation  8  Wrist supination  7  Grip strength (lbs)    (Blank rows = not tested)     PALPATION:  No unexpected TTP  TREATMENT DATE:  8/21 Manual:  Extension ROM with biceps release  Review of self biceps release  Biceps flexion with distraction Radial head mobilization with supination/pronation stretching   There-ex: attempted UBE with no resistance but had pain so exercise stopped. Bicpes flexion in pain free range 3x12 3lbs  Elbow pronation/supination 3lbs  Neuro-re-ed:  Cable row 15 lbs 3x12  Cable extension 3x12 15 lbs     8/14 Manual:  Extension ROM with biceps  release  Review of self biceps release  Biceps flexion with distraction Radial head mobilization with supination/pronation stretching   There-ex:  Active elbow flexion 2x10 low RPE added 1lb no pain x10  Triceps extension 3x10  Shoulder flexion 3x10  Supination pronation 3x10 1 lb added after the first set    8/7: Dressing change PROM elbow (gentle) in supine with towel roll under arm PROM shoulder flexion Passive gentle pronation/supination  Eval: Bandage change- no s/s of infection; bandages removed, sutures in place, clean dry intact; tegaderm and fresh gauze replaced   Manual:  Extension ROM with biceps release  Review of self biceps release   There-ex:  PROM into Flexion/ Extension/ Pronation/ Supination   PATIENT EDUCATION: Education details: HEP, symptom management, progression of activity  Person educated: Patient Education method: Explanation, Demonstration, Tactile cues, Verbal cues, and Handouts Education comprehension: verbalized understanding, returned demonstration, verbal cues required, tactile cues required, and needs further education  HOME EXERCISE PROGRAM: Access Code: [email or web address] URL: [email or web address] Date: 11/27/2023 Prepared by: Alm Don   ASSESSMENT:  CLINICAL IMPRESSION:  The patient continues to progress well. He had a large trigger point in his triceps today that improved with manual therapy. We reviewed light gym activity while keeping a low RPE. He had no pain. We will continue to progress as tolerated.   Eval: Patient is a 43 year old male status post left elbow cartilage debridement and microfracture procedure performed on 11/24/2023.  At this time he presents with expected limitations in range of motion, strength, and general functional use of left upper extremity.  He would benefit from skilled therapy to return to work and return to active lifestyle.  OBJECTIVE IMPAIRMENTS: decreased activity tolerance, decreased  ROM, decreased strength, impaired UE functional use, and pain.   ACTIVITY LIMITATIONS: carrying, lifting, bathing, dressing, reach over head, and hygiene/grooming  PARTICIPATION LIMITATIONS: meal prep, cleaning, laundry, driving, shopping, community activity, occupation, and yard work  PERSONAL FACTORS: None  REHAB POTENTIAL: Excellent  CLINICAL DECISION MAKING: Stable/uncomplicated  EVALUATION COMPLEXITY: Low  GOALS: Goals reviewed with patient? Yes  SHORT TERM GOALS: Target date: 12/25/2023    Patient will increase left elbow flexion to 140 degrees  Baseline: Goal status: INITIAL  2.  Patient will demonstrate full elbow extension on the left without pain  Baseline:  Goal status: INITIAL  3.  Patient will wean out of the sling  Baseline:  Goal status: INITIAL  4.  Patient will be independent with base HEP  Baseline:  Goal status: INITIAL   LONG TERM GOALS: Target date: 01/22/2024    Patient will use left UE for all ADL's without pain  Baseline:  Goal status: INITIAL  2.  Patient will return to work without pain in his elbow  Baseline:  Goal status: INITIAL  3.  Patient will use return to the gym without pain.  Baseline:  Goal status: INITIAL    PLAN: PT FREQUENCY: 2x/week  PT DURATION: 8 weeks  PLANNED INTERVENTIONS: 97110-Therapeutic exercises, 97530- Therapeutic activity, [id number]- Neuromuscular re-education, 97535- Self Care, [id number]-  Manual therapy, [id number]- Gait training, [id number]- Aquatic Therapy, [id number]- Electrical stimulation (unattended), [id number]- Ultrasound, Patient/Family education, Stair training, Taping, Dry Needling, DME instructions, Cryotherapy, and Moist heat   PLAN FOR NEXT SESSION:  Per MD patient had micro fx and clean up. He can progress as tolerated. Continue PROM. Consider self shoulder flexion. Add in wrist exercises. Progress to active motion as tolerated.    Alm JINNY Don, PT 12/18/2023, 2:25 PM

## 2023-12-18 ENCOUNTER — Encounter (HOSPITAL_BASED_OUTPATIENT_CLINIC_OR_DEPARTMENT_OTHER): Payer: Self-pay | Admitting: Physical Therapy

## 2023-12-21 ENCOUNTER — Encounter (HOSPITAL_BASED_OUTPATIENT_CLINIC_OR_DEPARTMENT_OTHER)

## 2023-12-23 ENCOUNTER — Encounter (HOSPITAL_BASED_OUTPATIENT_CLINIC_OR_DEPARTMENT_OTHER)

## 2023-12-24 ENCOUNTER — Ambulatory Visit (HOSPITAL_BASED_OUTPATIENT_CLINIC_OR_DEPARTMENT_OTHER): Payer: Self-pay

## 2023-12-24 ENCOUNTER — Encounter (HOSPITAL_BASED_OUTPATIENT_CLINIC_OR_DEPARTMENT_OTHER): Payer: Self-pay

## 2023-12-24 DIAGNOSIS — M25622 Stiffness of left elbow, not elsewhere classified: Secondary | ICD-10-CM

## 2023-12-24 DIAGNOSIS — M25522 Pain in left elbow: Secondary | ICD-10-CM

## 2023-12-24 NOTE — Therapy (Signed)
 OUTPATIENT PHYSICAL THERAPY UPPER EXTREMITY TREATMENT   Patient Name: Tanner Diaz MRN: [id number] DOB:July 02, 1980, 43 y.o., male Today's Date: 12/24/2023  END OF SESSION:  PT End of Session - 12/24/23 1603     Visit Number 5    Number of Visits 16    Date for PT Re-Evaluation 01/22/24    PT Start Time 1604    PT Stop Time 1645    PT Time Calculation (min) 41 min    Activity Tolerance Patient tolerated treatment well    Behavior During Therapy WFL for tasks assessed/performed             Past Medical History:  Diagnosis Date   GERD (gastroesophageal reflux disease)    Past Surgical History:  Procedure Laterality Date   ELBOW ARTHROSCOPY Left 11/24/2023   Procedure: ARTHROSCOPY, ELBOW;  Surgeon: Genelle Standing, MD;  Location: [facility];  Service: Orthopedics;  Laterality: Left;  LEFT ELBOW ARTHROSCOPY WITH DEBRIDEMENT WITH MICROFRACTURE   WISDOM TOOTH EXTRACTION     Patient Active Problem List   Diagnosis Date Noted   Articular cartilage disorder of elbow, left 11/24/2023   Pain of upper extremity 10/04/2023   Dehydration 03/22/2023   Snoring 03/22/2023   Lip swelling 12/07/2022   Palpitations 12/23/2021   Strain of lumbar region 03/06/2021   Neck pain 09/22/2018   Foot pain 09/15/2017   Advance care planning 09/15/2014   Tinea versicolor 09/15/2014   Back pain 09/15/2014   Routine general medical examination at a health care facility 12/31/2011   GERD (gastroesophageal reflux disease) 03/26/2011    PCP: Dr Arlyss Solian MD  REFERRING PROVIDER: Dr Standing Genelle MD   REFERRING DIAG: Left Elbow Debridement   THERAPY DIAG:  Pain in left elbow  Stiffness of left elbow, not elsewhere classified  Rationale for Evaluation and Treatment: Rehabilitation  ONSET DATE: 11/24/2023  SUBJECTIVE:                                                                                                                                                                                       SUBJECTIVE STATEMENT: Pt reports elbow has been improving. Some pain still with certain movements.   Eval: He reports  he was performing chin ups when he felt a pop in the back of his arm above the elbow. He has since been unable to fully extend the elbow and has had significant swelling in the area which has started to improve. He was found to have a cartilage defect in his elbow. On 11/24/2023 he had a elbow scope and clean up.  Hand dominance: Right  PERTINENT HISTORY: Nothing pertinent   PAIN:  Are you having pain? Yes: NPRS scale: 0/10  Pain location: lateral elbow and inside the elbow  Pain description: aching  Aggravating factors: hitting the elbow  Relieving factors: comes and goes   PRECAUTIONS: None  RED FLAGS: None   WEIGHT BEARING RESTRICTIONS: Yes WBAT   FALLS:  Has patient fallen in last 6 months? No  LIVING ENVIRONMENT:  OCCUPATION: Works in [employer] Hobbies:  Likes to work out    PLOF: Independent  PATIENT GOALS:  To have use of his elbow  NEXT MD VISIT:  Next week.  OBJECTIVE:  Note: Objective measures were completed at Evaluation unless otherwise noted.  DIAGNOSTIC FINDINGS:  Nothing post op   PATIENT SURVEYS :  UEFS  Extreme difficulty/unable (0), Quite a bit of difficulty (1), Moderate difficulty (2), Little difficulty (3), No difficulty (4) Survey date:    Any of your usual work, household or school activities   2. Your usual hobbies, recreational/sport activities    3. Lifting a bag of groceries to waist level    4. Lifting a bag of groceries above your head   5. Grooming your hair   6. Pushing up on your hands (I.e. from bathtub or chair)   7. Preparing food (I.e. peeling/cutting)   8. Driving    9. Vacuuming, sweeping, or raking   10. Dressing    11. Doing up buttons   12. Using tools/appliances   13. Opening doors   14. Cleaning    15. Tying or lacing shoes   16. Sleeping    17. Laundering  clothes (I.e. washing, ironing, folding)   18. Opening a jar   19. Throwing a ball   20. Carrying a small suitcase with your affected limb.    Score total:  12/80     COGNITION: Overall cognitive status: Within functional limits for tasks assessed     SENSATION: ]block just wore off yesterday otherwise WNL   POSTURE:   UPPER EXTREMITY ROM:   MMT Right eval Left eval  Shoulder flexion    Shoulder extension    Shoulder abduction    Shoulder adduction    Shoulder internal rotation    Shoulder external rotation    Elbow flexion    Elbow extension    Wrist flexion    Wrist extension    Wrist ulnar deviation    Wrist radial deviation    Wrist pronation    Wrist supination    (Blank rows = not tested) Limited pronation/supination   UPPER EXTREMITY MMT:  PROM  Right eval Left eval  Shoulder flexion    Shoulder extension    Shoulder abduction    Shoulder adduction    Shoulder internal rotation    Shoulder external rotation    Middle trapezius    Lower trapezius    Elbow flexion  100  Elbow extension  -20 from straight   Wrist flexion    Wrist extension    Wrist ulnar deviation    Wrist radial deviation    Wrist pronation  8  Wrist supination  7  Grip strength (lbs)    (Blank rows = not tested)     PALPATION:  No unexpected TTP  TREATMENT DATE:   8/21 PROM with end range stretching Supine elbow flex/ext 3# 2x10 Prone triceps extension 0# 2x10 Prone row 3# 2x10 Therabar twists (RED) x30ea Pronation/supination 2# x20ea Standing shoulder flexion 2# 2x10 Standing lateral raise 2# 2x10 Standing OH press 2# press Cable column single arm row 10# 2x10 Cable column single shoulder push out- 5# 2x10 UBE 4' alternating fwd/back. L1    8/14 Manual:  Extension ROM with biceps release  Review of self biceps release  Biceps  flexion with distraction Radial head mobilization with supination/pronation stretching   There-ex:  Active elbow flexion 2x10 low RPE added 1lb no pain x10  Triceps extension 3x10  Shoulder flexion 3x10  Supination pronation 3x10 1 lb added after the first set    8/7: Dressing change PROM elbow (gentle) in supine with towel roll under arm PROM shoulder flexion Passive gentle pronation/supination  Eval: Bandage change- no s/s of infection; bandages removed, sutures in place, clean dry intact; tegaderm and fresh gauze replaced   Manual:  Extension ROM with biceps release  Review of self biceps release   There-ex:  PROM into Flexion/ Extension/ Pronation/ Supination   PATIENT EDUCATION: Education details: HEP, symptom management, progression of activity  Person educated: Patient Education method: Explanation, Demonstration, Tactile cues, Verbal cues, and Handouts Education comprehension: verbalized understanding, returned demonstration, verbal cues required, tactile cues required, and needs further education  HOME EXERCISE PROGRAM: Access Code: [email or web address] URL: [email or web address] Date: 11/27/2023 Prepared by: Alm Don   ASSESSMENT:  CLINICAL IMPRESSION:  Able to gently progress forearm, elbow, and shoulder strengthening today with good tolerance. Improving in ROM, though tightness at end range elbow flexion and extension. Took care to stay within pain limitations. Able to complete UBE today with cues for using R UE more than L initially. No c/o pain throughout session. Educated about DOMS and management. Will continue to progress as tolerated.   Eval: Patient is a 43 year old male status post left elbow cartilage debridement and microfracture procedure performed on 11/24/2023.  At this time he presents with expected limitations in range of motion, strength, and general functional use of left upper extremity.  He would benefit from skilled therapy to return  to work and return to active lifestyle.  OBJECTIVE IMPAIRMENTS: decreased activity tolerance, decreased ROM, decreased strength, impaired UE functional use, and pain.   ACTIVITY LIMITATIONS: carrying, lifting, bathing, dressing, reach over head, and hygiene/grooming  PARTICIPATION LIMITATIONS: meal prep, cleaning, laundry, driving, shopping, community activity, occupation, and yard work  PERSONAL FACTORS: None  REHAB POTENTIAL: Excellent  CLINICAL DECISION MAKING: Stable/uncomplicated  EVALUATION COMPLEXITY: Low  GOALS: Goals reviewed with patient? Yes  SHORT TERM GOALS: Target date: 12/25/2023    Patient will increase left elbow flexion to 140 degrees  Baseline: Goal status: INITIAL  2.  Patient will demonstrate full elbow extension on the left without pain  Baseline:  Goal status: INITIAL  3.  Patient will wean out of the sling  Baseline:  Goal status: INITIAL  4.  Patient will be independent with base HEP  Baseline:  Goal status: INITIAL   LONG TERM GOALS: Target date: 01/22/2024    Patient will use left UE for all ADL's without pain  Baseline:  Goal status: INITIAL  2.  Patient will return to work without pain in his elbow  Baseline:  Goal status: INITIAL  3.  Patient will use return to the gym without pain.  Baseline:  Goal status: INITIAL    PLAN: PT FREQUENCY: 2x/week  PT  DURATION: 8 weeks  PLANNED INTERVENTIONS: 97110-Therapeutic exercises, 97530- Therapeutic activity, [id number]- Neuromuscular re-education, 97535- Self Care, [id number]- Manual therapy, [id number]- Gait training, [phone]- Aquatic Therapy, [id number]- Electrical stimulation (unattended), 97035- Ultrasound, Patient/Family education, Stair training, Taping, Dry Needling, DME instructions, Cryotherapy, and Moist heat   PLAN FOR NEXT SESSION:  Per MD patient had micro fx and clean up. He can progress as tolerated. Continue PROM. Consider self shoulder flexion. Add in wrist exercises. Progress to active  motion as tolerated.    Asberry BRAVO Tanner Diaz, PTA 12/24/2023, 4:52 PM

## 2023-12-29 ENCOUNTER — Ambulatory Visit (HOSPITAL_BASED_OUTPATIENT_CLINIC_OR_DEPARTMENT_OTHER): Attending: Orthopaedic Surgery | Admitting: Physical Therapy

## 2023-12-29 ENCOUNTER — Encounter (HOSPITAL_BASED_OUTPATIENT_CLINIC_OR_DEPARTMENT_OTHER): Payer: Self-pay | Admitting: Physical Therapy

## 2023-12-29 DIAGNOSIS — M25622 Stiffness of left elbow, not elsewhere classified: Secondary | ICD-10-CM | POA: Diagnosis present

## 2023-12-29 DIAGNOSIS — M25522 Pain in left elbow: Secondary | ICD-10-CM | POA: Insufficient documentation

## 2023-12-29 NOTE — Therapy (Signed)
 OUTPATIENT PHYSICAL THERAPY UPPER EXTREMITY TREATMENT   Patient Name: Tanner Diaz MRN: [id number] DOB:[birth year], 43 y.o., male Today's Date: 12/29/2023  END OF SESSION:       Past Medical History:  Diagnosis Date   GERD (gastroesophageal reflux disease)    Past Surgical History:  Procedure Laterality Date   ELBOW ARTHROSCOPY Left 11/24/2023   Procedure: ARTHROSCOPY, ELBOW;  Surgeon: Genelle Standing, MD;  Location: [facility];  Service: Orthopedics;  Laterality: Left;  LEFT ELBOW ARTHROSCOPY WITH DEBRIDEMENT WITH MICROFRACTURE   WISDOM TOOTH EXTRACTION     Patient Active Problem List   Diagnosis Date Noted   Articular cartilage disorder of elbow, left 11/24/2023   Pain of upper extremity 10/04/2023   Dehydration 03/22/2023   Snoring 03/22/2023   Lip swelling 12/07/2022   Palpitations 12/23/2021   Strain of lumbar region 03/06/2021   Neck pain 09/22/2018   Foot pain 09/15/2017   Advance care planning 09/15/2014   Tinea versicolor 09/15/2014   Back pain 09/15/2014   Routine general medical examination at a health care facility 12/31/2011   GERD (gastroesophageal reflux disease) 03/26/2011    PCP: Dr Arlyss Solian MD  REFERRING PROVIDER: Dr Standing Genelle MD   REFERRING DIAG: Left Elbow Debridement   THERAPY DIAG:  No diagnosis found.  Rationale for Evaluation and Treatment: Rehabilitation  ONSET DATE: 11/24/2023  SUBJECTIVE:                                                                                                                                                                                      SUBJECTIVE STATEMENT: The patient reports it is getting better and better. He just has some tension in the elbow    Eval: He reports  he was performing chin ups when he felt a pop in the back of his arm above the elbow. He has since been unable to fully extend the elbow and has had significant swelling in the area which has started to improve.  He was found to have a cartilage defect in his elbow. On 11/24/2023 he had a elbow scope and clean up.  Hand dominance: Right  PERTINENT HISTORY: Nothing pertinent   PAIN:  Are you having pain? Yes: NPRS scale: 0/10  Pain location: lateral elbow and inside the elbow  Pain description: aching  Aggravating factors: hitting the elbow  Relieving factors: comes and goes   PRECAUTIONS: None  RED FLAGS: None   WEIGHT BEARING RESTRICTIONS: Yes WBAT   FALLS:  Has patient fallen in last 6 months? No  LIVING ENVIRONMENT:  OCCUPATION: Works in [employer] Hobbies:  Likes to work out    [employer]: Independent  PATIENT GOALS:  To have use of his elbow  NEXT MD VISIT:  Next week.  OBJECTIVE:  Note: Objective measures were completed at Evaluation unless otherwise noted.  DIAGNOSTIC FINDINGS:  Nothing post op   PATIENT SURVEYS :  UEFS  Extreme difficulty/unable (0), Quite a bit of difficulty (1), Moderate difficulty (2), Little difficulty (3), No difficulty (4) Survey date:    Any of your usual work, household or school activities   2. Your usual hobbies, recreational/sport activities    3. Lifting a bag of groceries to waist level    4. Lifting a bag of groceries above your head   5. Grooming your hair   6. Pushing up on your hands (I.e. from bathtub or chair)   7. Preparing food (I.e. peeling/cutting)   8. Driving    9. Vacuuming, sweeping, or raking   10. Dressing    11. Doing up buttons   12. Using tools/appliances   13. Opening doors   14. Cleaning    15. Tying or lacing shoes   16. Sleeping    17. Laundering clothes (I.e. washing, ironing, folding)   18. Opening a jar   19. Throwing a ball   20. Carrying a small suitcase with your affected limb.    Score total:  12/80     COGNITION: Overall cognitive status: Within functional limits for tasks assessed     SENSATION: ]block just wore off yesterday otherwise WNL   POSTURE:   UPPER EXTREMITY ROM:    MMT Right eval Left eval  Shoulder flexion    Shoulder extension    Shoulder abduction    Shoulder adduction    Shoulder internal rotation    Shoulder external rotation    Elbow flexion    Elbow extension    Wrist flexion    Wrist extension    Wrist ulnar deviation    Wrist radial deviation    Wrist pronation    Wrist supination    (Blank rows = not tested) Limited pronation/supination   UPPER EXTREMITY MMT:  PROM  Right eval Left eval  Shoulder flexion    Shoulder extension    Shoulder abduction    Shoulder adduction    Shoulder internal rotation    Shoulder external rotation    Middle trapezius    Lower trapezius    Elbow flexion  100  Elbow extension  -20 from straight   Wrist flexion    Wrist extension    Wrist ulnar deviation    Wrist radial deviation    Wrist pronation  8  Wrist supination  7  Grip strength (lbs)    (Blank rows = not tested)     PALPATION:  No unexpected TTP                                                                                                                              TREATMENT DATE:  09/2  There-ex:  GRACEANN  2 min fwd 2 min back  Wall push up 3x12  Shoulder press 3x12 4 lbs  Supination/ pronation 3x12   Cable Row 3x12 20 lbs  Shoulder extension 15 lbs 3x12   Manual:  Assessed ROM    8/21 PROM with end range stretching Supine elbow flex/ext 3# 2x10 Prone triceps extension 0# 2x10 Prone row 3# 2x10 Therabar twists (RED) x30ea Pronation/supination 2# x20ea Standing shoulder flexion 2# 2x10 Standing lateral raise 2# 2x10 Standing OH press 2# press Cable column single arm row 10# 2x10 Cable column single shoulder push out- 5# 2x10 UBE 4' alternating fwd/back. L1    8/14 Manual:  Extension ROM with biceps release  Review of self biceps release  Biceps flexion with distraction Radial head mobilization with supination/pronation stretching   There-ex:  Active elbow flexion 2x10 low RPE added 1lb no  pain x10  Triceps extension 3x10  Shoulder flexion 3x10  Supination pronation 3x10 1 lb added after the first set    8/7: Dressing change PROM elbow (gentle) in supine with towel roll under arm PROM shoulder flexion Passive gentle pronation/supination  Eval: Bandage change- no s/s of infection; bandages removed, sutures in place, clean dry intact; tegaderm and fresh gauze replaced   Manual:  Extension ROM with biceps release  Review of self biceps release   There-ex:  PROM into Flexion/ Extension/ Pronation/ Supination   PATIENT EDUCATION: Education details: HEP, symptom management, progression of activity  Person educated: Patient Education method: Explanation, Demonstration, Tactile cues, Verbal cues, and Handouts Education comprehension: verbalized understanding, returned demonstration, verbal cues required, tactile cues required, and needs further education  HOME EXERCISE PROGRAM: Access Code: [email or web address] URL: [email or web address] Date: 11/27/2023 Prepared by: Alm Don   ASSESSMENT:  CLINICAL IMPRESSION:  The patient continues to progress well. We worked on exercises that he can progress into the gym with. He was advised to report to therapy how his elbow feels the next few days. He was advise dif he feels good he can go into the gym and work on the things that he worked on. We reviewed how to progress using RPE. Elbow total arc measured at 0-141.    Eval: Patient is a 43 year old male status post left elbow cartilage debridement and microfracture procedure performed on 11/24/2023.  At this time he presents with expected limitations in range of motion, strength, and general functional use of left upper extremity.  He would benefit from skilled therapy to return to work and return to active lifestyle.  OBJECTIVE IMPAIRMENTS: decreased activity tolerance, decreased ROM, decreased strength, impaired UE functional use, and pain.   ACTIVITY LIMITATIONS:  carrying, lifting, bathing, dressing, reach over head, and hygiene/grooming  PARTICIPATION LIMITATIONS: meal prep, cleaning, laundry, driving, shopping, community activity, occupation, and yard work  PERSONAL FACTORS: None  REHAB POTENTIAL: Excellent  CLINICAL DECISION MAKING: Stable/uncomplicated  EVALUATION COMPLEXITY: Low  GOALS: Goals reviewed with patient? Yes  SHORT TERM GOALS: Target date: 12/25/2023    Patient will increase left elbow flexion to 140 degrees  Baseline: Goal status: achieved 9/2   2.  Patient will demonstrate full elbow extension on the left without pain  Baseline:  Goal status: minor tension in the elbow  9/2   3.  Patient will wean out of the sling  Baseline:  Goal status: INITIAL  4.  Patient will be independent with base HEP  Baseline:  Goal status: INITIAL   LONG TERM GOALS: Target date: 01/22/2024    Patient will use left UE for all ADL's  without pain  Baseline:  Goal status: INITIAL  2.  Patient will return to work without pain in his elbow  Baseline:  Goal status: INITIAL  3.  Patient will use return to the gym without pain.  Baseline:  Goal status: INITIAL    PLAN: PT FREQUENCY: 2x/week  PT DURATION: 8 weeks  PLANNED INTERVENTIONS: 97110-Therapeutic exercises, 97530- Therapeutic activity, [id number]- Neuromuscular re-education, 97535- Self Care, [id number]- Manual therapy, [id number]- Gait training, [phone]- Aquatic Therapy, 97014- Electrical stimulation (unattended), 97035- Ultrasound, Patient/Family education, Stair training, Taping, Dry Needling, DME instructions, Cryotherapy, and Moist heat   PLAN FOR NEXT SESSION:  Per MD patient had micro fx and clean up. He can progress as tolerated. Continue PROM. Consider self shoulder flexion. Add in wrist exercises. Progress to active motion as tolerated.    Alm JINNY Don, PT 12/29/2023, 10:19 AM

## 2023-12-30 ENCOUNTER — Encounter (HOSPITAL_BASED_OUTPATIENT_CLINIC_OR_DEPARTMENT_OTHER): Payer: Self-pay | Admitting: Orthopaedic Surgery

## 2023-12-31 ENCOUNTER — Encounter (HOSPITAL_BASED_OUTPATIENT_CLINIC_OR_DEPARTMENT_OTHER): Payer: Self-pay

## 2023-12-31 ENCOUNTER — Ambulatory Visit (HOSPITAL_BASED_OUTPATIENT_CLINIC_OR_DEPARTMENT_OTHER)

## 2023-12-31 DIAGNOSIS — M25522 Pain in left elbow: Secondary | ICD-10-CM

## 2023-12-31 DIAGNOSIS — M25622 Stiffness of left elbow, not elsewhere classified: Secondary | ICD-10-CM

## 2023-12-31 NOTE — Therapy (Signed)
 OUTPATIENT PHYSICAL THERAPY UPPER EXTREMITY TREATMENT   Patient Name: EURAL HOLZSCHUH MRN: [id number] DOB:1980-09-29, 43 y.o., male Today's Date: 12/31/2023  END OF SESSION:  PT End of Session - 12/31/23 1125     Visit Number 8    Number of Visits 16    Date for PT Re-Evaluation 01/22/24    PT Start Time 1104    PT Stop Time 1144    PT Time Calculation (min) 40 min    Activity Tolerance Patient tolerated treatment well    Behavior During Therapy WFL for tasks assessed/performed              Past Medical History:  Diagnosis Date   GERD (gastroesophageal reflux disease)    Past Surgical History:  Procedure Laterality Date   ELBOW ARTHROSCOPY Left 11/24/2023   Procedure: ARTHROSCOPY, ELBOW;  Surgeon: Genelle Standing, MD;  Location: [facility];  Service: Orthopedics;  Laterality: Left;  LEFT ELBOW ARTHROSCOPY WITH DEBRIDEMENT WITH MICROFRACTURE   WISDOM TOOTH EXTRACTION     Patient Active Problem List   Diagnosis Date Noted   Articular cartilage disorder of elbow, left 11/24/2023   Pain of upper extremity 10/04/2023   Dehydration 03/22/2023   Snoring 03/22/2023   Lip swelling 12/07/2022   Palpitations 12/23/2021   Strain of lumbar region 03/06/2021   Neck pain 09/22/2018   Foot pain 09/15/2017   Advance care planning 09/15/2014   Tinea versicolor 09/15/2014   Back pain 09/15/2014   Routine general medical examination at a health care facility 12/31/2011   GERD (gastroesophageal reflux disease) 03/26/2011    PCP: Dr Arlyss Solian MD  REFERRING PROVIDER: Dr Standing Genelle MD   REFERRING DIAG: Left Elbow Debridement   THERAPY DIAG:  Pain in left elbow  Stiffness of left elbow, not elsewhere classified  Rationale for Evaluation and Treatment: Rehabilitation  ONSET DATE: 11/24/2023  SUBJECTIVE:                                                                                                                                                                                       SUBJECTIVE STATEMENT: Pt denies pain in elbow after progressions last visit. No pain at entry. States he gets some tightness in forearm.    Eval: He reports  he was performing chin ups when he felt a pop in the back of his arm above the elbow. He has since been unable to fully extend the elbow and has had significant swelling in the area which has started to improve. He was found to have a cartilage defect in his elbow. On 11/24/2023 he had a elbow scope and clean up.  Hand  dominance: Right  PERTINENT HISTORY: Nothing pertinent   PAIN:  Are you having pain? Yes: NPRS scale: 0/10  Pain location: lateral elbow and inside the elbow  Pain description: aching  Aggravating factors: hitting the elbow  Relieving factors: comes and goes   PRECAUTIONS: None  RED FLAGS: None   WEIGHT BEARING RESTRICTIONS: Yes WBAT   FALLS:  Has patient fallen in last 6 months? No  LIVING ENVIRONMENT:  OCCUPATION: Works in [employer] Hobbies:  Likes to work out    PLOF: Independent  PATIENT GOALS:  To have use of his elbow  NEXT MD VISIT:  Next week.  OBJECTIVE:  Note: Objective measures were completed at Evaluation unless otherwise noted.  DIAGNOSTIC FINDINGS:  Nothing post op   PATIENT SURVEYS :  UEFS  Extreme difficulty/unable (0), Quite a bit of difficulty (1), Moderate difficulty (2), Little difficulty (3), No difficulty (4) Survey date:    Any of your usual work, household or school activities   2. Your usual hobbies, recreational/sport activities    3. Lifting a bag of groceries to waist level    4. Lifting a bag of groceries above your head   5. Grooming your hair   6. Pushing up on your hands (I.e. from bathtub or chair)   7. Preparing food (I.e. peeling/cutting)   8. Driving    9. Vacuuming, sweeping, or raking   10. Dressing    11. Doing up buttons   12. Using tools/appliances   13. Opening doors   14. Cleaning    15. Tying or lacing  shoes   16. Sleeping    17. Laundering clothes (I.e. washing, ironing, folding)   18. Opening a jar   19. Throwing a ball   20. Carrying a small suitcase with your affected limb.    Score total:  12/80     COGNITION: Overall cognitive status: Within functional limits for tasks assessed     SENSATION: ]block just wore off yesterday otherwise WNL   POSTURE:   UPPER EXTREMITY ROM:   MMT Right eval Left eval  Shoulder flexion    Shoulder extension    Shoulder abduction    Shoulder adduction    Shoulder internal rotation    Shoulder external rotation    Elbow flexion    Elbow extension    Wrist flexion    Wrist extension    Wrist ulnar deviation    Wrist radial deviation    Wrist pronation    Wrist supination    (Blank rows = not tested) Limited pronation/supination   UPPER EXTREMITY MMT:  PROM  Right eval Left eval  Shoulder flexion    Shoulder extension    Shoulder abduction    Shoulder adduction    Shoulder internal rotation    Shoulder external rotation    Middle trapezius    Lower trapezius    Elbow flexion  100  Elbow extension  -20 from straight   Wrist flexion    Wrist extension    Wrist ulnar deviation    Wrist radial deviation    Wrist pronation  8  Wrist supination  7  Grip strength (lbs)    (Blank rows = not tested)     PALPATION:  No unexpected TTP  TREATMENT DATE:   9/4 PROM L elbow, passive stretching into extension STM to wrist extensors Quadruped weight shifting Wrist extensor stretch 3x20sec UBE fwd/back L1.0 Cable column: Single arm row 20# 3x10 Push out with bar (bilateral) 10lb x10, 15lb 2x10 Bicep curls with bar (bilateral) 10# 3x10 Triceps extension with bar (bilateral) 15lb 3x10  OH press 5# 3x10   09/2  There-ex:  UBE 2 min fwd 2 min back  Wall push up 3x12  Shoulder press 3x12  4 lbs  Supination/ pronation 3x12   Cable Row 3x12 20 lbs  Shoulder extension 15 lbs 3x12   Manual:  Assessed ROM    8/21 PROM with end range stretching Supine elbow flex/ext 3# 2x10 Prone triceps extension 0# 2x10 Prone row 3# 2x10 Therabar twists (RED) x30ea Pronation/supination 2# x20ea Standing shoulder flexion 2# 2x10 Standing lateral raise 2# 2x10 Standing OH press 2# press Cable column single arm row 10# 2x10 Cable column single shoulder push out- 5# 2x10 UBE 4' alternating fwd/back. L1    8/14 Manual:  Extension ROM with biceps release  Review of self biceps release  Biceps flexion with distraction Radial head mobilization with supination/pronation stretching   There-ex:  Active elbow flexion 2x10 low RPE added 1lb no pain x10  Triceps extension 3x10  Shoulder flexion 3x10  Supination pronation 3x10 1 lb added after the first set    8/7: Dressing change PROM elbow (gentle) in supine with towel roll under arm PROM shoulder flexion Passive gentle pronation/supination  Eval: Bandage change- no s/s of infection; bandages removed, sutures in place, clean dry intact; tegaderm and fresh gauze replaced   Manual:  Extension ROM with biceps release  Review of self biceps release   There-ex:  PROM into Flexion/ Extension/ Pronation/ Supination   PATIENT EDUCATION: Education details: HEP, symptom management, progression of activity  Person educated: Patient Education method: Explanation, Demonstration, Tactile cues, Verbal cues, and Handouts Education comprehension: verbalized understanding, returned demonstration, verbal cues required, tactile cues required, and needs further education  HOME EXERCISE PROGRAM: Access Code: [email or web address] URL: [email or web address] Date: 11/27/2023 Prepared by: Alm Don   ASSESSMENT:  CLINICAL IMPRESSION:  STM performed to wrist extensors where he does have tightness/tenderness. Instructed in stretching  for this as well. Trialled quadruped UE weight shifting with good tolerance. Continued to gently progress with strengthening due to positive response thus far. No complaints of pain with progressions.    Eval: Patient is a 43 year old male status post left elbow cartilage debridement and microfracture procedure performed on 11/24/2023.  At this time he presents with expected limitations in range of motion, strength, and general functional use of left upper extremity.  He would benefit from skilled therapy to return to work and return to active lifestyle.  OBJECTIVE IMPAIRMENTS: decreased activity tolerance, decreased ROM, decreased strength, impaired UE functional use, and pain.   ACTIVITY LIMITATIONS: carrying, lifting, bathing, dressing, reach over head, and hygiene/grooming  PARTICIPATION LIMITATIONS: meal prep, cleaning, laundry, driving, shopping, community activity, occupation, and yard work  PERSONAL FACTORS: None  REHAB POTENTIAL: Excellent  CLINICAL DECISION MAKING: Stable/uncomplicated  EVALUATION COMPLEXITY: Low  GOALS: Goals reviewed with patient? Yes  SHORT TERM GOALS: Target date: 12/25/2023    Patient will increase left elbow flexion to 140 degrees  Baseline: Goal status: achieved 9/2   2.  Patient will demonstrate full elbow extension on the left without pain  Baseline:  Goal status: minor tension in the elbow  9/2   3.  Patient will wean out  of the sling  Baseline:  Goal status: INITIAL  4.  Patient will be independent with base HEP  Baseline:  Goal status: INITIAL   LONG TERM GOALS: Target date: 01/22/2024    Patient will use left UE for all ADL's without pain  Baseline:  Goal status: INITIAL  2.  Patient will return to work without pain in his elbow  Baseline:  Goal status: INITIAL  3.  Patient will use return to the gym without pain.  Baseline:  Goal status: INITIAL    PLAN: PT FREQUENCY: 2x/week  PT DURATION: 8 weeks  PLANNED  INTERVENTIONS: 97110-Therapeutic exercises, 97530- Therapeutic activity, [id number]- Neuromuscular re-education, 97535- Self Care, 02859- Manual therapy, [id number]- Gait training, [phone]- Aquatic Therapy, 97014- Electrical stimulation (unattended), 97035- Ultrasound, Patient/Family education, Stair training, Taping, Dry Needling, DME instructions, Cryotherapy, and Moist heat   PLAN FOR NEXT SESSION:  Per MD patient had micro fx and clean up. He can progress as tolerated. Continue PROM. Consider self shoulder flexion. Add in wrist exercises. Progress to active motion as tolerated.    Asberry BRAVO Berania Peedin, PTA 12/31/2023, 12:22 PM

## 2024-01-05 ENCOUNTER — Ambulatory Visit (HOSPITAL_BASED_OUTPATIENT_CLINIC_OR_DEPARTMENT_OTHER): Admitting: Physical Therapy

## 2024-01-07 ENCOUNTER — Ambulatory Visit (HOSPITAL_BASED_OUTPATIENT_CLINIC_OR_DEPARTMENT_OTHER): Admitting: Orthopaedic Surgery

## 2024-01-07 DIAGNOSIS — M24122 Other articular cartilage disorders, left elbow: Secondary | ICD-10-CM

## 2024-01-07 NOTE — Progress Notes (Signed)
 Post Operative Evaluation    Procedure/Date of Surgery: Left elbow microfracture with debridement 7/29  Interval History:   Presents today status post the above procedure.  Overall he is doing well.  He made steady progress with physical therapy.  Overall very mild pain   PMH/PSH/Family History/Social History/Meds/Allergies:    Past Medical History:  Diagnosis Date   GERD (gastroesophageal reflux disease)    Past Surgical History:  Procedure Laterality Date   ELBOW ARTHROSCOPY Left 11/24/2023   Procedure: ARTHROSCOPY, ELBOW;  Surgeon: Genelle Standing, MD;  Location: [facility];  Service: Orthopedics;  Laterality: Left;  LEFT ELBOW ARTHROSCOPY WITH DEBRIDEMENT WITH MICROFRACTURE   WISDOM TOOTH EXTRACTION     Social History   Socioeconomic History   Marital status: Married    Spouse name: Not on file   Number of children: Not on file   Years of education: Not on file   Highest education level: Not on file  Occupational History   Occupation: Civil engineer, contracting: [employer]  Tobacco Use   Smoking status: Never    Passive exposure: Past   Smokeless tobacco: Never  Vaping Use   Vaping status: Never Used  Substance and Sexual Activity   Alcohol use: No    Alcohol/week: 0.0 standard drinks of alcohol   Drug use: No   Sexual activity: Not on file  Other Topics Concern   Not on file  Social History Narrative   [employer]   [employer] fan   Married 2004   4 daughters   Likes to hunt   Social Drivers of Corporate investment banker Strain: Not on file  Food Insecurity: Not on file  Transportation Needs: Not on file  Physical Activity: Not on file  Stress: Not on file  Social Connections: Not on file   Family History  Problem Relation Age of Onset   Stroke Father    Diabetes Father    Diabetes Other    Colon cancer Neg Hx    Prostate cancer Neg Hx    Allergies  Allergen Reactions   Milk-Related  Compounds Other (See Comments)    GI upset   Current Outpatient Medications  Medication Sig Dispense Refill   methocarbamol  (ROBAXIN ) 500 MG tablet Take 1 tablet (500 mg total) by mouth 4 (four) times daily. 30 tablet 3   aspirin  EC 325 MG tablet Take 1 tablet (325 mg total) by mouth daily. 14 tablet 0   EPINEPHrine  0.3 mg/0.3 mL IJ SOAJ injection Inject 0.3 mg into the muscle as needed for anaphylaxis. 2 each 1   ketoconazole  (NIZORAL ) 2 % shampoo Apply 1 Application topically as directed. Wash trunk daily until clear then wash weekly for prevention. Let sit a few minutes then rinse. 120 mL 6   loratadine  (CLARITIN ) 10 MG tablet Take 1 tablet (10 mg total) by mouth daily.     meloxicam  (MOBIC ) 15 MG tablet Take 0.5-1 tablets (7.5-15 mg total) by mouth daily. Take with food.  Don't take with ibuprofen  or aleve . (Patient not taking: Reported on 11/17/2023) 30 tablet 1   oxyCODONE  (ROXICODONE ) 5 MG immediate release tablet Take 1 tablet (5 mg total) by mouth every 4 (four) hours as needed for severe pain (pain score 7-10) or breakthrough pain. 10 tablet 0  No current facility-administered medications for this visit.   No results found.  Review of Systems:   A ROS was performed including pertinent positives and negatives as documented in the HPI.   Musculoskeletal Exam:    There were no vitals taken for this visit.  Left elbow incisions are well-appearing without erythema or drainage.  There is some weakness with triceps extension.  Otherwise distal neurosensory exam is intact  Imaging:      I personally reviewed and interpreted the radiographs.   Assessment:   6 weeks status post left elbow microfracture and debridement doing well.  He is making progress to physical therapy.  At this time we will plan to keep him on light duty restrictions for an additional 4 weeks and cleared him back to full duty at that time Plan :    - Return to clinic 4 weeks for reassessment      I  personally saw and evaluated the patient, and participated in the management and treatment plan.  Elspeth Parker, MD Attending Physician, Orthopedic Surgery  This document was dictated using Dragon voice recognition software. A reasonable attempt at proof reading has been made to minimize errors.

## 2024-01-08 ENCOUNTER — Ambulatory Visit (HOSPITAL_BASED_OUTPATIENT_CLINIC_OR_DEPARTMENT_OTHER): Payer: Self-pay | Admitting: Physical Therapy

## 2024-01-08 DIAGNOSIS — M25522 Pain in left elbow: Secondary | ICD-10-CM | POA: Diagnosis not present

## 2024-01-08 DIAGNOSIS — M25622 Stiffness of left elbow, not elsewhere classified: Secondary | ICD-10-CM

## 2024-01-09 ENCOUNTER — Encounter (HOSPITAL_BASED_OUTPATIENT_CLINIC_OR_DEPARTMENT_OTHER): Payer: Self-pay | Admitting: Physical Therapy

## 2024-01-09 NOTE — Therapy (Signed)
 OUTPATIENT PHYSICAL THERAPY UPPER EXTREMITY TREATMENT   Patient Name: Tanner Diaz MRN: [id number] DOB:February 08, 1981, 43 y.o., male Today's Date: 01/09/2024  END OF SESSION:  PT End of Session - 01/09/24 1455     Visit Number 9    Number of Visits 16    Date for PT Re-Evaluation 01/22/24    PT Start Time 1600    PT Stop Time 1641    PT Time Calculation (min) 41 min    Activity Tolerance Patient tolerated treatment well    Behavior During Therapy WFL for tasks assessed/performed              Past Medical History:  Diagnosis Date   GERD (gastroesophageal reflux disease)    Past Surgical History:  Procedure Laterality Date   ELBOW ARTHROSCOPY Left 11/24/2023   Procedure: ARTHROSCOPY, ELBOW;  Surgeon: Genelle Standing, MD;  Location: [facility];  Service: Orthopedics;  Laterality: Left;  LEFT ELBOW ARTHROSCOPY WITH DEBRIDEMENT WITH MICROFRACTURE   WISDOM TOOTH EXTRACTION     Patient Active Problem List   Diagnosis Date Noted   Articular cartilage disorder of elbow, left 11/24/2023   Pain of upper extremity 10/04/2023   Dehydration 03/22/2023   Snoring 03/22/2023   Lip swelling 12/07/2022   Palpitations 12/23/2021   Strain of lumbar region 03/06/2021   Neck pain 09/22/2018   Foot pain 09/15/2017   Advance care planning 09/15/2014   Tinea versicolor 09/15/2014   Back pain 09/15/2014   Routine general medical examination at a health care facility 12/31/2011   GERD (gastroesophageal reflux disease) 03/26/2011    PCP: Dr Arlyss Solian MD  REFERRING PROVIDER: Dr Standing Genelle MD   REFERRING DIAG: Left Elbow Debridement   THERAPY DIAG:  Pain in left elbow  Stiffness of left elbow, not elsewhere classified  Rationale for Evaluation and Treatment: Rehabilitation  ONSET DATE: 11/24/2023  SUBJECTIVE:                                                                                                                                                                                       SUBJECTIVE STATEMENT: Patient continues to have no pain.  He felt good after the last session.  He has been going to the gym and performing light activity.  Eval: He reports  he was performing chin ups when he felt a pop in the back of his arm above the elbow. He has since been unable to fully extend the elbow and has had significant swelling in the area which has started to improve. He was found to have a cartilage defect in his elbow. On 11/24/2023 he had a elbow scope and  clean up.  Hand dominance: Right  PERTINENT HISTORY: Nothing pertinent   PAIN:  Are you having pain? Yes: NPRS scale: 0/10  Pain location: lateral elbow and inside the elbow  Pain description: aching  Aggravating factors: hitting the elbow  Relieving factors: comes and goes   PRECAUTIONS: None  RED FLAGS: None   WEIGHT BEARING RESTRICTIONS: Yes WBAT   FALLS:  Has patient fallen in last 6 months? No  LIVING ENVIRONMENT:  OCCUPATION: Works in [employer] Hobbies:  Likes to work out    PLOF: Independent  PATIENT GOALS:  To have use of his elbow  NEXT MD VISIT:  Next week.  OBJECTIVE:  Note: Objective measures were completed at Evaluation unless otherwise noted.  DIAGNOSTIC FINDINGS:  Nothing post op   PATIENT SURVEYS :  UEFS  Extreme difficulty/unable (0), Quite a bit of difficulty (1), Moderate difficulty (2), Little difficulty (3), No difficulty (4) Survey date:    Any of your usual work, household or school activities   2. Your usual hobbies, recreational/sport activities    3. Lifting a bag of groceries to waist level    4. Lifting a bag of groceries above your head   5. Grooming your hair   6. Pushing up on your hands (I.e. from bathtub or chair)   7. Preparing food (I.e. peeling/cutting)   8. Driving    9. Vacuuming, sweeping, or raking   10. Dressing    11. Doing up buttons   12. Using tools/appliances   13. Opening doors   14. Cleaning    15.  Tying or lacing shoes   16. Sleeping    17. Laundering clothes (I.e. washing, ironing, folding)   18. Opening a jar   19. Throwing a ball   20. Carrying a small suitcase with your affected limb.    Score total:  12/80     COGNITION: Overall cognitive status: Within functional limits for tasks assessed     SENSATION: ]block just wore off yesterday otherwise WNL   POSTURE:   UPPER EXTREMITY ROM:   MMT Right eval Left eval  Shoulder flexion    Shoulder extension    Shoulder abduction    Shoulder adduction    Shoulder internal rotation    Shoulder external rotation    Elbow flexion    Elbow extension    Wrist flexion    Wrist extension    Wrist ulnar deviation    Wrist radial deviation    Wrist pronation    Wrist supination    (Blank rows = not tested) Limited pronation/supination   UPPER EXTREMITY MMT:  PROM  Right eval Left eval  Shoulder flexion    Shoulder extension    Shoulder abduction    Shoulder adduction    Shoulder internal rotation    Shoulder external rotation    Middle trapezius    Lower trapezius    Elbow flexion  100  Elbow extension  -20 from straight   Wrist flexion    Wrist extension    Wrist ulnar deviation    Wrist radial deviation    Wrist pronation  8  Wrist supination  7  Grip strength (lbs)    (Blank rows = not tested)     PALPATION:  No unexpected TTP  TREATMENT DATE:  9/13 There ex: Bicep curls 10 pounds RPE of two 2 x 12 15 pounds x 12 RPE of 4  Dead lift 26 pounds 3 x 12 with kettle bell from raised platform  Dumbbell incline press 15 pounds x 10 felt pain in his elbow drop to 5 pounds 2 x 10 no pain  Tricep press down 40 pounds 3 x 12  Life fitness chest press 30 pounds 3 x 12 without coming to full extension  Leg fitness LAT pulldown 30 pounds 3 x 12 RPE of 4  Life fitness row 40  pounds RPE of four 3 x 12  9/4 PROM L elbow, passive stretching into extension STM to wrist extensors Quadruped weight shifting Wrist extensor stretch 3x20sec UBE fwd/back L1.0 Cable column: Single arm row 20# 3x10 Push out with bar (bilateral) 10lb x10, 15lb 2x10 Bicep curls with bar (bilateral) 10# 3x10 Triceps extension with bar (bilateral) 15lb 3x10  OH press 5# 3x10   09/2  There-ex:  UBE 2 min fwd 2 min back  Wall push up 3x12  Shoulder press 3x12 4 lbs  Supination/ pronation 3x12   Cable Row 3x12 20 lbs  Shoulder extension 15 lbs 3x12   Manual:  Assessed ROM    8/21 PROM with end range stretching Supine elbow flex/ext 3# 2x10 Prone triceps extension 0# 2x10 Prone row 3# 2x10 Therabar twists (RED) x30ea Pronation/supination 2# x20ea Standing shoulder flexion 2# 2x10 Standing lateral raise 2# 2x10 Standing OH press 2# press Cable column single arm row 10# 2x10 Cable column single shoulder push out- 5# 2x10 UBE 4' alternating fwd/back. L1    8/14 Manual:  Extension ROM with biceps release  Review of self biceps release  Biceps flexion with distraction Radial head mobilization with supination/pronation stretching   There-ex:  Active elbow flexion 2x10 low RPE added 1lb no pain x10  Triceps extension 3x10  Shoulder flexion 3x10  Supination pronation 3x10 1 lb added after the first set    8/7: Dressing change PROM elbow (gentle) in supine with towel roll under arm PROM shoulder flexion Passive gentle pronation/supination  Eval: Bandage change- no s/s of infection; bandages removed, sutures in place, clean dry intact; tegaderm and fresh gauze replaced   Manual:  Extension ROM with biceps release  Review of self biceps release   There-ex:  PROM into Flexion/ Extension/ Pronation/ Supination   PATIENT EDUCATION: Education details: HEP, symptom management, progression of activity  Person educated: Patient Education method:  Explanation, Demonstration, Tactile cues, Verbal cues, and Handouts Education comprehension: verbalized understanding, returned demonstration, verbal cues required, tactile cues required, and needs further education  HOME EXERCISE PROGRAM: Access Code: [email or web address] URL: [email or web address] Date: 11/27/2023 Prepared by: Alm Don   ASSESSMENT:  CLINICAL IMPRESSION:  Patient continues to progress well.  We continue to expand his gym program.  We have kept his RPE low but tried several different machines and free weight exercises today.  The only exercise he had difficulty really was incline dumbbell press.  He had no difficulty with with machine bench press.  He is advised not to go aggressively and then ranges with extension activities.  He will continue to work on his own gym exercises.  Eval: Patient is a 43 year old male status post left elbow cartilage debridement and microfracture procedure performed on 11/24/2023.  At this time he presents with expected limitations in range of motion, strength, and general functional use of left upper extremity.  He would benefit from skilled therapy  to return to work and return to active lifestyle.  OBJECTIVE IMPAIRMENTS: decreased activity tolerance, decreased ROM, decreased strength, impaired UE functional use, and pain.   ACTIVITY LIMITATIONS: carrying, lifting, bathing, dressing, reach over head, and hygiene/grooming  PARTICIPATION LIMITATIONS: meal prep, cleaning, laundry, driving, shopping, community activity, occupation, and yard work  PERSONAL FACTORS: None  REHAB POTENTIAL: Excellent  CLINICAL DECISION MAKING: Stable/uncomplicated  EVALUATION COMPLEXITY: Low  GOALS: Goals reviewed with patient? Yes  SHORT TERM GOALS: Target date: 12/25/2023    Patient will increase left elbow flexion to 140 degrees  Baseline: Goal status: achieved 9/2   2.  Patient will demonstrate full elbow extension on the left without pain   Baseline:  Goal status: minor tension in the elbow  9/2   3.  Patient will wean out of the sling  Baseline:  Goal status: INITIAL  4.  Patient will be independent with base HEP  Baseline:  Goal status: INITIAL   LONG TERM GOALS: Target date: 01/22/2024    Patient will use left UE for all ADL's without pain  Baseline:  Goal status: INITIAL  2.  Patient will return to work without pain in his elbow  Baseline:  Goal status: INITIAL  3.  Patient will use return to the gym without pain.  Baseline:  Goal status: INITIAL    PLAN: PT FREQUENCY: 2x/week  PT DURATION: 8 weeks  PLANNED INTERVENTIONS: 97110-Therapeutic exercises, 97530- Therapeutic activity, [id number]- Neuromuscular re-education, 97535- Self Care, [id number]- Manual therapy, [id number]- Gait training, [phone]- Aquatic Therapy, 97014- Electrical stimulation (unattended), 97035- Ultrasound, Patient/Family education, Stair training, Taping, Dry Needling, DME instructions, Cryotherapy, and Moist heat   PLAN FOR NEXT SESSION:  Per MD patient had micro fx and clean up. He can progress as tolerated. Continue PROM. Consider self shoulder flexion. Add in wrist exercises. Progress to active motion as tolerated.    Alm JINNY Don, PT 01/09/2024, 2:57 PM

## 2024-01-15 ENCOUNTER — Encounter (HOSPITAL_BASED_OUTPATIENT_CLINIC_OR_DEPARTMENT_OTHER): Payer: Self-pay

## 2024-01-15 ENCOUNTER — Ambulatory Visit (HOSPITAL_BASED_OUTPATIENT_CLINIC_OR_DEPARTMENT_OTHER): Payer: Self-pay

## 2024-01-15 DIAGNOSIS — M25522 Pain in left elbow: Secondary | ICD-10-CM | POA: Diagnosis not present

## 2024-01-15 DIAGNOSIS — M25622 Stiffness of left elbow, not elsewhere classified: Secondary | ICD-10-CM

## 2024-01-15 NOTE — Therapy (Signed)
 OUTPATIENT PHYSICAL THERAPY UPPER EXTREMITY TREATMENT   Patient Name: KYRILLOS ADAMS MRN: [id number] DOB:1981-03-21, 43 y.o., male Today's Date: 01/15/2024  END OF SESSION:  PT End of Session - 01/15/24 0942     Visit Number 10    Number of Visits 16    Date for Recertification  01/22/24    PT Start Time 0938    PT Stop Time 1016    PT Time Calculation (min) 38 min    Activity Tolerance Patient tolerated treatment well    Behavior During Therapy [facility] for tasks assessed/performed               Past Medical History:  Diagnosis Date   GERD (gastroesophageal reflux disease)    Past Surgical History:  Procedure Laterality Date   ELBOW ARTHROSCOPY Left 11/24/2023   Procedure: ARTHROSCOPY, ELBOW;  Surgeon: Genelle Standing, MD;  Location: [facility];  Service: Orthopedics;  Laterality: Left;  LEFT ELBOW ARTHROSCOPY WITH DEBRIDEMENT WITH MICROFRACTURE   WISDOM TOOTH EXTRACTION     Patient Active Problem List   Diagnosis Date Noted   Articular cartilage disorder of elbow, left 11/24/2023   Pain of upper extremity 10/04/2023   Dehydration 03/22/2023   Snoring 03/22/2023   Lip swelling 12/07/2022   Palpitations 12/23/2021   Strain of lumbar region 03/06/2021   Neck pain 09/22/2018   Foot pain 09/15/2017   Advance care planning 09/15/2014   Tinea versicolor 09/15/2014   Back pain 09/15/2014   Routine general medical examination at a health care facility 12/31/2011   GERD (gastroesophageal reflux disease) 03/26/2011    PCP: Dr Arlyss Solian MD  REFERRING PROVIDER: Dr Standing Genelle MD   REFERRING DIAG: Left Elbow Debridement   THERAPY DIAG:  Pain in left elbow  Stiffness of left elbow, not elsewhere classified  Rationale for Evaluation and Treatment: Rehabilitation  ONSET DATE: 11/24/2023  SUBJECTIVE:                                                                                                                                                                                       SUBJECTIVE STATEMENT: Pt denies pain at entry. Gym workouts have been going well. I've been doing similar things to what I do here.  Eval: He reports  he was performing chin ups when he felt a pop in the back of his arm above the elbow. He has since been unable to fully extend the elbow and has had significant swelling in the area which has started to improve. He was found to have a cartilage defect in his elbow. On 11/24/2023 he had a elbow scope and clean up.  Hand  dominance: Right  PERTINENT HISTORY: Nothing pertinent   PAIN:  Are you having pain? Yes: NPRS scale: 0/10  Pain location: lateral elbow and inside the elbow  Pain description: aching  Aggravating factors: hitting the elbow  Relieving factors: comes and goes   PRECAUTIONS: None  RED FLAGS: None   WEIGHT BEARING RESTRICTIONS: Yes WBAT   FALLS:  Has patient fallen in last 6 months? No  LIVING ENVIRONMENT:  OCCUPATION: Works in [employer] Hobbies:  Likes to work out    PLOF: Independent  PATIENT GOALS:  To have use of his elbow  NEXT MD VISIT:  Next week.  OBJECTIVE:  Note: Objective measures were completed at Evaluation unless otherwise noted.  DIAGNOSTIC FINDINGS:  Nothing post op   PATIENT SURVEYS :  UEFS  Extreme difficulty/unable (0), Quite a bit of difficulty (1), Moderate difficulty (2), Little difficulty (3), No difficulty (4) Survey date:    Any of your usual work, household or school activities   2. Your usual hobbies, recreational/sport activities    3. Lifting a bag of groceries to waist level    4. Lifting a bag of groceries above your head   5. Grooming your hair   6. Pushing up on your hands (I.e. from bathtub or chair)   7. Preparing food (I.e. peeling/cutting)   8. Driving    9. Vacuuming, sweeping, or raking   10. Dressing    11. Doing up buttons   12. Using tools/appliances   13. Opening doors   14. Cleaning    15. Tying or lacing  shoes   16. Sleeping    17. Laundering clothes (I.e. washing, ironing, folding)   18. Opening a jar   19. Throwing a ball   20. Carrying a small suitcase with your affected limb.    Score total:  12/80     COGNITION: Overall cognitive status: Within functional limits for tasks assessed     SENSATION: ]block just wore off yesterday otherwise WNL   POSTURE:   UPPER EXTREMITY ROM:   MMT Right eval Left eval  Shoulder flexion    Shoulder extension    Shoulder abduction    Shoulder adduction    Shoulder internal rotation    Shoulder external rotation    Elbow flexion    Elbow extension    Wrist flexion    Wrist extension    Wrist ulnar deviation    Wrist radial deviation    Wrist pronation    Wrist supination    (Blank rows = not tested) Limited pronation/supination   UPPER EXTREMITY MMT:  PROM  Right eval Left eval  Shoulder flexion    Shoulder extension    Shoulder abduction    Shoulder adduction    Shoulder internal rotation    Shoulder external rotation    Middle trapezius    Lower trapezius    Elbow flexion  100  Elbow extension  -20 from straight   Wrist flexion    Wrist extension    Wrist ulnar deviation    Wrist radial deviation    Wrist pronation  8  Wrist supination  7  Grip strength (lbs)    (Blank rows = not tested)     PALPATION:  No unexpected TTP  TREATMENT DATE:   01/15/24: UBE alternating- L3  Dumbbell incline press 10 pounds 3x10  Bent over row on weight bench Bent over tricep kicks 5# 3x10 Incline flys 5lb x10, 10lb dumbbells 2x10 Farmer carry 26lb dumbbell x1lap around track (L only) Bicep curls 15lb 3x10  Tricep press down 45 pounds 3 x12  Life fitness LAT pulldown 40 pounds 3 x 12  Quadruped weight shifting Small range quadruped push ups 2x10   9/13 There ex: Bicep curls 10 pounds  RPE of two 2 x 12 15 pounds x 12 RPE of 4  Dead lift 26 pounds 3 x 12 with kettle bell from raised platform  Dumbbell incline press 15 pounds x 10 felt pain in his elbow drop to 5 pounds 2 x 10 no pain  Tricep press down 40 pounds 3 x 12  Life fitness chest press 30 pounds 3 x 12 without coming to full extension  Life fitness LAT pulldown 30pounds 3 x 12   Life fitness row 45 pounds RPE of four 3 x 12  9/4 PROM L elbow, passive stretching into extension STM to wrist extensors Quadruped weight shifting Wrist extensor stretch 3x20sec UBE fwd/back L1.0 Cable column: Single arm row 20# 3x10 Push out with bar (bilateral) 10lb x10, 15lb 2x10 Bicep curls with bar (bilateral) 10# 3x10 Triceps extension with bar (bilateral) 15lb 3x10  OH press 5# 3x10   09/2  There-ex:  UBE 2 min fwd 2 min back  Wall push up 3x12  Shoulder press 3x12 4 lbs  Supination/ pronation 3x12   Cable Row 3x12 20 lbs  Shoulder extension 15 lbs 3x12   Manual:  Assessed ROM    8/21 PROM with end range stretching Supine elbow flex/ext 3# 2x10 Prone triceps extension 0# 2x10 Prone row 3# 2x10 Therabar twists (RED) x30ea Pronation/supination 2# x20ea Standing shoulder flexion 2# 2x10 Standing lateral raise 2# 2x10 Standing OH press 2# press Cable column single arm row 10# 2x10 Cable column single shoulder push out- 5# 2x10 UBE 4' alternating fwd/back. L1    8/14 Manual:  Extension ROM with biceps release  Review of self biceps release  Biceps flexion with distraction Radial head mobilization with supination/pronation stretching   There-ex:  Active elbow flexion 2x10 low RPE added 1lb no pain x10  Triceps extension 3x10  Shoulder flexion 3x10  Supination pronation 3x10 1 lb added after the first set    8/7: Dressing change PROM elbow (gentle) in supine with towel roll under arm PROM shoulder flexion Passive gentle pronation/supination  Eval: Bandage change- no s/s of  infection; bandages removed, sutures in place, clean dry intact; tegaderm and fresh gauze replaced   Manual:  Extension ROM with biceps release  Review of self biceps release   There-ex:  PROM into Flexion/ Extension/ Pronation/ Supination   PATIENT EDUCATION: Education details: HEP, symptom management, progression of activity  Person educated: Patient Education method: Explanation, Demonstration, Tactile cues, Verbal cues, and Handouts Education comprehension: verbalized understanding, returned demonstration, verbal cues required, tactile cues required, and needs further education  HOME EXERCISE PROGRAM: Access Code: [email or web address] URL: [email or web address] Date: 11/27/2023 Prepared by: Alm Don   ASSESSMENT:  CLINICAL IMPRESSION:  Good tolerance for progressions to exercise program today. Pt reported significant imporved tolerance for inclined bench press with dumbbells today compared to last session. He demonstrated mild fatigue iwht bent over triceps kick back. Trialled quadruped weight shifting and low range push ups with fatigue noted, but no increase in pain.  Eval: Patient is a 43 year old male status post left elbow cartilage debridement and microfracture procedure performed on 11/24/2023.  At this time he presents with expected limitations in range of motion, strength, and general functional use of left upper extremity.  He would benefit from skilled therapy to return to work and return to active lifestyle.  OBJECTIVE IMPAIRMENTS: decreased activity tolerance, decreased ROM, decreased strength, impaired UE functional use, and pain.   ACTIVITY LIMITATIONS: carrying, lifting, bathing, dressing, reach over head, and hygiene/grooming  PARTICIPATION LIMITATIONS: meal prep, cleaning, laundry, driving, shopping, community activity, occupation, and yard work  PERSONAL FACTORS: None  REHAB POTENTIAL: Excellent  CLINICAL DECISION MAKING:  Stable/uncomplicated  EVALUATION COMPLEXITY: Low  GOALS: Goals reviewed with patient? Yes  SHORT TERM GOALS: Target date: 12/25/2023    Patient will increase left elbow flexion to 140 degrees  Baseline: Goal status: achieved 9/2   2.  Patient will demonstrate full elbow extension on the left without pain  Baseline:  Goal status: minor tension in the elbow  9/2   3.  Patient will wean out of the sling  Baseline:  Goal status: INITIAL  4.  Patient will be independent with base HEP  Baseline:  Goal status: INITIAL   LONG TERM GOALS: Target date: 01/22/2024    Patient will use left UE for all ADL's without pain  Baseline:  Goal status: INITIAL  2.  Patient will return to work without pain in his elbow  Baseline:  Goal status: INITIAL  3.  Patient will use return to the gym without pain.  Baseline:  Goal status: INITIAL    PLAN: PT FREQUENCY: 2x/week  PT DURATION: 8 weeks  PLANNED INTERVENTIONS: 97110-Therapeutic exercises, 97530- Therapeutic activity, [id number]- Neuromuscular re-education, 97535- Self Care, 02859- Manual therapy, [id number]- Gait training, [phone]- Aquatic Therapy, 97014- Electrical stimulation (unattended), 97035- Ultrasound, Patient/Family education, Stair training, Taping, Dry Needling, DME instructions, Cryotherapy, and Moist heat   PLAN FOR NEXT SESSION:  Per MD patient had micro fx and clean up. He can progress as tolerated. Continue PROM. Consider self shoulder flexion. Add in wrist exercises. Progress to active motion as tolerated.    Asberry BRAVO Calysta Craigo, PTA 01/15/2024, 12:00 PM

## 2024-01-20 ENCOUNTER — Ambulatory Visit (HOSPITAL_BASED_OUTPATIENT_CLINIC_OR_DEPARTMENT_OTHER): Payer: Self-pay | Admitting: Physical Therapy

## 2024-01-20 ENCOUNTER — Encounter (HOSPITAL_BASED_OUTPATIENT_CLINIC_OR_DEPARTMENT_OTHER): Payer: Self-pay | Admitting: Physical Therapy

## 2024-01-20 DIAGNOSIS — M25622 Stiffness of left elbow, not elsewhere classified: Secondary | ICD-10-CM

## 2024-01-20 DIAGNOSIS — M25522 Pain in left elbow: Secondary | ICD-10-CM

## 2024-01-20 NOTE — Therapy (Signed)
 OUTPATIENT PHYSICAL THERAPY UPPER EXTREMITY TREATMENT   Patient Name: Tanner Diaz MRN: [id number] DOB:05/17/1980, 43 y.o., male Today's Date: 01/21/2024  END OF SESSION:  PT End of Session - 01/20/24 1655     Visit Number 11    Number of Visits 16    Date for Recertification  01/22/24    PT Start Time 1645    PT Stop Time 1724    PT Time Calculation (min) 39 min    Activity Tolerance Patient tolerated treatment well    Behavior During Therapy WFL for tasks assessed/performed               Past Medical History:  Diagnosis Date   GERD (gastroesophageal reflux disease)    Past Surgical History:  Procedure Laterality Date   ELBOW ARTHROSCOPY Left 11/24/2023   Procedure: ARTHROSCOPY, ELBOW;  Surgeon: Genelle Standing, MD;  Location: [facility];  Service: Orthopedics;  Laterality: Left;  LEFT ELBOW ARTHROSCOPY WITH DEBRIDEMENT WITH MICROFRACTURE   WISDOM TOOTH EXTRACTION     Patient Active Problem List   Diagnosis Date Noted   Articular cartilage disorder of elbow, left 11/24/2023   Pain of upper extremity 10/04/2023   Dehydration 03/22/2023   Snoring 03/22/2023   Lip swelling 12/07/2022   Palpitations 12/23/2021   Strain of lumbar region 03/06/2021   Neck pain 09/22/2018   Foot pain 09/15/2017   Advance care planning 09/15/2014   Tinea versicolor 09/15/2014   Back pain 09/15/2014   Routine general medical examination at a health care facility 12/31/2011   GERD (gastroesophageal reflux disease) 03/26/2011    PCP: Dr Arlyss Solian MD  REFERRING PROVIDER: Dr Standing Genelle MD   REFERRING DIAG: Left Elbow Debridement   THERAPY DIAG:  Pain in left elbow  Stiffness of left elbow, not elsewhere classified  Rationale for Evaluation and Treatment: Rehabilitation  ONSET DATE: 11/24/2023  SUBJECTIVE:                                                                                                                                                                                       SUBJECTIVE STATEMENT: The patient reports he had some swelling over the weekend but had no significant pain.  Eval: He reports  he was performing chin ups when he felt a pop in the back of his arm above the elbow. He has since been unable to fully extend the elbow and has had significant swelling in the area which has started to improve. He was found to have a cartilage defect in his elbow. On 11/24/2023 he had a elbow scope and clean up.  Hand dominance: Right  PERTINENT HISTORY: Nothing  pertinent   PAIN:  Are you having pain? Yes: NPRS scale: 0/10  Pain location: lateral elbow and inside the elbow  Pain description: aching  Aggravating factors: hitting the elbow  Relieving factors: comes and goes   PRECAUTIONS: None  RED FLAGS: None   WEIGHT BEARING RESTRICTIONS: Yes WBAT   FALLS:  Has patient fallen in last 6 months? No  LIVING ENVIRONMENT:  OCCUPATION: Works in [employer] Hobbies:  Likes to work out    PLOF: Independent  PATIENT GOALS:  To have use of his elbow  NEXT MD VISIT:  Next week.  OBJECTIVE:  Note: Objective measures were completed at Evaluation unless otherwise noted.  DIAGNOSTIC FINDINGS:  Nothing post op   PATIENT SURVEYS :  UEFS  Extreme difficulty/unable (0), Quite a bit of difficulty (1), Moderate difficulty (2), Little difficulty (3), No difficulty (4) Survey date:    Any of your usual work, household or school activities   2. Your usual hobbies, recreational/sport activities    3. Lifting a bag of groceries to waist level    4. Lifting a bag of groceries above your head   5. Grooming your hair   6. Pushing up on your hands (I.e. from bathtub or chair)   7. Preparing food (I.e. peeling/cutting)   8. Driving    9. Vacuuming, sweeping, or raking   10. Dressing    11. Doing up buttons   12. Using tools/appliances   13. Opening doors   14. Cleaning    15. Tying or lacing shoes   16. Sleeping    17.  Laundering clothes (I.e. washing, ironing, folding)   18. Opening a jar   19. Throwing a ball   20. Carrying a small suitcase with your affected limb.    Score total:  12/80     COGNITION: Overall cognitive status: Within functional limits for tasks assessed     SENSATION: ]block just wore off yesterday otherwise WNL   POSTURE:   UPPER EXTREMITY ROM:   MMT Right eval Left eval  Shoulder flexion    Shoulder extension    Shoulder abduction    Shoulder adduction    Shoulder internal rotation    Shoulder external rotation    Elbow flexion    Elbow extension    Wrist flexion    Wrist extension    Wrist ulnar deviation    Wrist radial deviation    Wrist pronation    Wrist supination    (Blank rows = not tested) Limited pronation/supination   UPPER EXTREMITY MMT:  PROM  Right eval Left eval  Shoulder flexion    Shoulder extension    Shoulder abduction    Shoulder adduction    Shoulder internal rotation    Shoulder external rotation    Middle trapezius    Lower trapezius    Elbow flexion  100  Elbow extension  -20 from straight   Wrist flexion    Wrist extension    Wrist ulnar deviation    Wrist radial deviation    Wrist pronation  8  Wrist supination  7  Grip strength (lbs)    (Blank rows = not tested)     PALPATION:  No unexpected TTP  TREATMENT DATE:  09/24 UBE alternating- L3  Dumbbell incline press 10 pounds 3x10  Bent over row on weight bench Bent over tricep kicks 5# 3x12 Incline flys 10lb dumbbells 3x12 Bicep curls 15lb 3x10 Split rope triceps extension 15 lbs  Cybex Bench 30 lbs 3x12   01/15/24: UBE alternating- L3  Dumbbell incline press 10 pounds 3x10  Bent over row on weight bench Bent over tricep kicks 5# 3x10 Incline flys 5lb x10, 10lb dumbbells 2x10 Farmer carry 26lb dumbbell x1lap around  track (L only) Bicep curls 15lb 3x10  Tricep press down 45 pounds 3 x12  Life fitness LAT pulldown 40 pounds 3 x 12  Quadruped weight shifting Small range quadruped push ups 2x10   9/13 There ex: Bicep curls 10 pounds RPE of two 2 x 12 15 pounds x 12 RPE of 4  Dead lift 26 pounds 3 x 12 with kettle bell from raised platform  Dumbbell incline press 15 pounds x 10 felt pain in his elbow drop to 5 pounds 2 x 10 no pain  Tricep press down 40 pounds 3 x 12  Life fitness chest press 30 pounds 3 x 12 without coming to full extension  Life fitness LAT pulldown 30pounds 3 x 12   Life fitness row 45 pounds RPE of four 3 x 12  9/4 PROM L elbow, passive stretching into extension STM to wrist extensors Quadruped weight shifting Wrist extensor stretch 3x20sec UBE fwd/back L1.0 Cable column: Single arm row 20# 3x10 Push out with bar (bilateral) 10lb x10, 15lb 2x10 Bicep curls with bar (bilateral) 10# 3x10 Triceps extension with bar (bilateral) 15lb 3x10  OH press 5# 3x10   09/2  There-ex:  UBE 2 min fwd 2 min back  Wall push up 3x12  Shoulder press 3x12 4 lbs  Supination/ pronation 3x12   Cable Row 3x12 20 lbs  Shoulder extension 15 lbs 3x12   Manual:  Assessed ROM    8/21 PROM with end range stretching Supine elbow flex/ext 3# 2x10 Prone triceps extension 0# 2x10 Prone row 3# 2x10 Therabar twists (RED) x30ea Pronation/supination 2# x20ea Standing shoulder flexion 2# 2x10 Standing lateral raise 2# 2x10 Standing OH press 2# press Cable column single arm row 10# 2x10 Cable column single shoulder push out- 5# 2x10 UBE 4' alternating fwd/back. L1    8/14 Manual:  Extension ROM with biceps release  Review of self biceps release  Biceps flexion with distraction Radial head mobilization with supination/pronation stretching   There-ex:  Active elbow flexion 2x10 low RPE added 1lb no pain x10  Triceps extension 3x10  Shoulder flexion 3x10  Supination  pronation 3x10 1 lb added after the first set    8/7: Dressing change PROM elbow (gentle) in supine with towel roll under arm PROM shoulder flexion Passive gentle pronation/supination  Eval: Bandage change- no s/s of infection; bandages removed, sutures in place, clean dry intact; tegaderm and fresh gauze replaced   Manual:  Extension ROM with biceps release  Review of self biceps release   There-ex:  PROM into Flexion/ Extension/ Pronation/ Supination   PATIENT EDUCATION: Education details: HEP, symptom management, progression of activity  Person educated: Patient Education method: Explanation, Demonstration, Tactile cues, Verbal cues, and Handouts Education comprehension: verbalized understanding, returned demonstration, verbal cues required, tactile cues required, and needs further education  HOME EXERCISE PROGRAM: Access Code: [email or web address] URL: [email or web address] Date: 11/27/2023 Prepared by: Alm Don   ASSESSMENT:  CLINICAL IMPRESSION:  The patient tolerated treatment well.  He had no significant pain. We held on the farmers carry's to see if they caused the inflammation. We were able to bump some of his exercises forward. We will continue to progress otherwise.  Eval: Patient is a 43 year old male status post left elbow cartilage debridement and microfracture procedure performed on 11/24/2023.  At this time he presents with expected limitations in range of motion, strength, and general functional use of left upper extremity.  He would benefit from skilled therapy to return to work and return to active lifestyle.  OBJECTIVE IMPAIRMENTS: decreased activity tolerance, decreased ROM, decreased strength, impaired UE functional use, and pain.   ACTIVITY LIMITATIONS: carrying, lifting, bathing, dressing, reach over head, and hygiene/grooming  PARTICIPATION LIMITATIONS: meal prep, cleaning, laundry, driving, shopping, community activity, occupation, and yard  work  PERSONAL FACTORS: None  REHAB POTENTIAL: Excellent  CLINICAL DECISION MAKING: Stable/uncomplicated  EVALUATION COMPLEXITY: Low  GOALS: Goals reviewed with patient? Yes  SHORT TERM GOALS: Target date: 12/25/2023    Patient will increase left elbow flexion to 140 degrees  Baseline: Goal status: achieved 9/2   2.  Patient will demonstrate full elbow extension on the left without pain  Baseline:  Goal status: minor tension in the elbow  9/2   3.  Patient will wean out of the sling  Baseline:  Goal status: INITIAL  4.  Patient will be independent with base HEP  Baseline:  Goal status: INITIAL   LONG TERM GOALS: Target date: 01/22/2024    Patient will use left UE for all ADL's without pain  Baseline:  Goal status: INITIAL  2.  Patient will return to work without pain in his elbow  Baseline:  Goal status: INITIAL  3.  Patient will use return to the gym without pain.  Baseline:  Goal status: INITIAL    PLAN: PT FREQUENCY: 2x/week  PT DURATION: 8 weeks  PLANNED INTERVENTIONS: 97110-Therapeutic exercises, 97530- Therapeutic activity, [id number]- Neuromuscular re-education, 97535- Self Care, [id number]- Manual therapy, [id number]- Gait training, [id number]- Aquatic Therapy, [id number]- Electrical stimulation (unattended), 97035- Ultrasound, Patient/Family education, Stair training, Taping, Dry Needling, DME instructions, Cryotherapy, and Moist heat   PLAN FOR NEXT SESSION:  Per MD patient had micro fx and clean up. He can progress as tolerated. Continue PROM. Consider self shoulder flexion. Add in wrist exercises. Progress to active motion as tolerated.    Alm JINNY Don, PT 01/21/2024, 10:31 AM

## 2024-01-21 ENCOUNTER — Encounter (HOSPITAL_BASED_OUTPATIENT_CLINIC_OR_DEPARTMENT_OTHER): Payer: Self-pay | Admitting: Physical Therapy

## 2024-01-29 ENCOUNTER — Ambulatory Visit (HOSPITAL_BASED_OUTPATIENT_CLINIC_OR_DEPARTMENT_OTHER): Payer: Self-pay | Admitting: Physical Therapy

## 2024-02-01 ENCOUNTER — Ambulatory Visit (HOSPITAL_BASED_OUTPATIENT_CLINIC_OR_DEPARTMENT_OTHER): Attending: Orthopaedic Surgery

## 2024-02-01 ENCOUNTER — Encounter (HOSPITAL_BASED_OUTPATIENT_CLINIC_OR_DEPARTMENT_OTHER): Payer: Self-pay

## 2024-02-01 DIAGNOSIS — M25522 Pain in left elbow: Secondary | ICD-10-CM | POA: Diagnosis present

## 2024-02-01 DIAGNOSIS — M25622 Stiffness of left elbow, not elsewhere classified: Secondary | ICD-10-CM | POA: Diagnosis present

## 2024-02-01 NOTE — Therapy (Signed)
 OUTPATIENT PHYSICAL THERAPY UPPER EXTREMITY TREATMENT   Patient Name: Tanner Diaz MRN: [id number] DOB:[birth year], 43 y.o., male Today's Date: 02/01/2024  END OF SESSION:  PT End of Session - 02/01/24 1608     Visit Number 12    Number of Visits 16    Date for Recertification  01/22/24    PT Start Time 1608   pt arrived late   PT Stop Time 1646    PT Time Calculation (min) 38 min    Activity Tolerance Patient tolerated treatment well    Behavior During Therapy WFL for tasks assessed/performed                Past Medical History:  Diagnosis Date   GERD (gastroesophageal reflux disease)    Past Surgical History:  Procedure Laterality Date   ELBOW ARTHROSCOPY Left 11/24/2023   Procedure: ARTHROSCOPY, ELBOW;  Surgeon: Genelle Standing, MD;  Location: [facility];  Service: Orthopedics;  Laterality: Left;  LEFT ELBOW ARTHROSCOPY WITH DEBRIDEMENT WITH MICROFRACTURE   WISDOM TOOTH EXTRACTION     Patient Active Problem List   Diagnosis Date Noted   Articular cartilage disorder of elbow, left 11/24/2023   Pain of upper extremity 10/04/2023   Dehydration 03/22/2023   Snoring 03/22/2023   Lip swelling 12/07/2022   Palpitations 12/23/2021   Strain of lumbar region 03/06/2021   Neck pain 09/22/2018   Foot pain 09/15/2017   Advance care planning 09/15/2014   Tinea versicolor 09/15/2014   Back pain 09/15/2014   Routine general medical examination at a health care facility 12/31/2011   GERD (gastroesophageal reflux disease) 03/26/2011    PCP: Dr Arlyss Solian MD  REFERRING PROVIDER: Dr Standing Genelle MD   REFERRING DIAG: Left Elbow Debridement   THERAPY DIAG:  Pain in left elbow  Stiffness of left elbow, not elsewhere classified  Rationale for Evaluation and Treatment: Rehabilitation  ONSET DATE: 11/24/2023  SUBJECTIVE:                                                                                                                                                                                       SUBJECTIVE STATEMENT: Pt reports no swelling since last session. No pain or issues in L elbow. Hoping to be released for full duty at upcoming MD appt.   Eval: He reports  he was performing chin ups when he felt a pop in the back of his arm above the elbow. He has since been unable to fully extend the elbow and has had significant swelling in the area which has started to improve. He was found to have a cartilage defect in his elbow. On 11/24/2023  he had a elbow scope and clean up.  Hand dominance: Right  PERTINENT HISTORY: Nothing pertinent   PAIN:  Are you having pain? No: NPRS scale: 0/10  Pain location: lateral elbow and inside the elbow  Pain description: aching  Aggravating factors: hitting the elbow  Relieving factors: comes and goes   PRECAUTIONS: None  RED FLAGS: None   WEIGHT BEARING RESTRICTIONS: Yes WBAT   FALLS:  Has patient fallen in last 6 months? No  LIVING ENVIRONMENT:  OCCUPATION: Works in [employer] Hobbies:  Likes to work out    PLOF: Independent  PATIENT GOALS:  To have use of his elbow  NEXT MD VISIT:  Next week.  OBJECTIVE:  Note: Objective measures were completed at Evaluation unless otherwise noted.  DIAGNOSTIC FINDINGS:  Nothing post op   PATIENT SURVEYS :  UEFS  Extreme difficulty/unable (0), Quite a bit of difficulty (1), Moderate difficulty (2), Little difficulty (3), No difficulty (4) Survey date:    Any of your usual work, household or school activities   2. Your usual hobbies, recreational/sport activities    3. Lifting a bag of groceries to waist level    4. Lifting a bag of groceries above your head   5. Grooming your hair   6. Pushing up on your hands (I.e. from bathtub or chair)   7. Preparing food (I.e. peeling/cutting)   8. Driving    9. Vacuuming, sweeping, or raking   10. Dressing    11. Doing up buttons   12. Using tools/appliances   13. Opening doors   14.  Cleaning    15. Tying or lacing shoes   16. Sleeping    17. Laundering clothes (I.e. washing, ironing, folding)   18. Opening a jar   19. Throwing a ball   20. Carrying a small suitcase with your affected limb.    Score total:  12/80     COGNITION: Overall cognitive status: Within functional limits for tasks assessed     SENSATION: ]block just wore off yesterday otherwise WNL   POSTURE:   UPPER EXTREMITY ROM:   MMT Right eval Left eval  Shoulder flexion    Shoulder extension    Shoulder abduction    Shoulder adduction    Shoulder internal rotation    Shoulder external rotation    Elbow flexion    Elbow extension    Wrist flexion    Wrist extension    Wrist ulnar deviation    Wrist radial deviation    Wrist pronation    Wrist supination    (Blank rows = not tested) Limited pronation/supination   UPPER EXTREMITY MMT:  PROM  Right eval Left eval  Shoulder flexion    Shoulder extension    Shoulder abduction    Shoulder adduction    Shoulder internal rotation    Shoulder external rotation    Middle trapezius    Lower trapezius    Elbow flexion  100  Elbow extension  -20 from straight   Wrist flexion    Wrist extension    Wrist ulnar deviation    Wrist radial deviation    Wrist pronation  8  Wrist supination  7  Grip strength (lbs)    (Blank rows = not tested)     PALPATION:  No unexpected TTP  TREATMENT DATE:   02/01/24: UBE alternating- L3 TRX row 3x10 Dumbbell incline press 10 pounds x10, 15# 2x15 Bent over row on weight bench 15lb 3x10 Bent over tricep kicks 5# 3x10 Incline flys 10lb dumbbells x10, 15# 2x10 Skull crushers 5# 2x10L only Farmer carry 26lb dumbbell x2laps around track (L only) OH KB carry down hall/back 10lb bottoms up  Cable column:  Push outs 15lb 3x10 Shoulder flexion 5# 3x10  Tricep  press down 55 pounds 3 x10 Life fitness LAT pulldown 55 pounds 3 x 10 Small range quadruped push ups 2x10  09/24 UBE alternating- L3  Dumbbell incline press 10 pounds 3x10  Bent over row on weight bench Bent over tricep kicks 5# 3x12 Incline flys 10lb dumbbells 3x12 Bicep curls 15lb 3x10 Split rope triceps extension 15 lbs  Cybex Bench 30 lbs 3x12   01/15/24: UBE alternating- L3  Dumbbell incline press 10 pounds 3x10  Bent over row on weight bench Bent over tricep kicks 5# 3x10 Incline flys 5lb x10, 10lb dumbbells 2x10 Farmer carry 26lb dumbbell x1lap around track (L only) Bicep curls 15lb 3x10  Tricep press down 45 pounds 3 x12  Life fitness LAT pulldown 40 pounds 3 x 12  Quadruped weight shifting Small range quadruped push ups 2x10   9/13 There ex: Bicep curls 10 pounds RPE of two 2 x 12 15 pounds x 12 RPE of 4  Dead lift 26 pounds 3 x 12 with kettle bell from raised platform  Dumbbell incline press 15 pounds x 10 felt pain in his elbow drop to 5 pounds 2 x 10 no pain  Tricep press down 40 pounds 3 x 12  Life fitness chest press 30 pounds 3 x 12 without coming to full extension  Life fitness LAT pulldown 30pounds 3 x 12   Life fitness row 45 pounds RPE of four 3 x 12  PATIENT EDUCATION: Education details: HEP, symptom management, progression of activity  Person educated: Patient Education method: Explanation, Demonstration, Tactile cues, Verbal cues, and Handouts Education comprehension: verbalized understanding, returned demonstration, verbal cues required, tactile cues required, and needs further education  HOME EXERCISE PROGRAM: Access Code: [email or web address] URL: [email or web address] Date: 11/27/2023 Prepared by: Alm Don   ASSESSMENT:  CLINICAL IMPRESSION:  Pt with good tolerance for exercise progressions. Pt making improvements each PT session. Added supine skull crushers today, which he did report mild crepitus in elbow  with, but denied any discomfort. Pt also challenged with quadruped push ups. He has f/u with MD this Thursday and is hoping to be release to full duty.    Eval: Patient is a 43 year old male status post left elbow cartilage debridement and microfracture procedure performed on 11/24/2023.  At this time he presents with expected limitations in range of motion, strength, and general functional use of left upper extremity.  He would benefit from skilled therapy to return to work and return to active lifestyle.  OBJECTIVE IMPAIRMENTS: decreased activity tolerance, decreased ROM, decreased strength, impaired UE functional use, and pain.   ACTIVITY LIMITATIONS: carrying, lifting, bathing, dressing, reach over head, and hygiene/grooming  PARTICIPATION LIMITATIONS: meal prep, cleaning, laundry, driving, shopping, community activity, occupation, and yard work  PERSONAL FACTORS: None  REHAB POTENTIAL: Excellent  CLINICAL DECISION MAKING: Stable/uncomplicated  EVALUATION COMPLEXITY: Low  GOALS: Goals reviewed with patient? Yes  SHORT TERM GOALS: Target date: 12/25/2023    Patient will increase left elbow flexion to 140 degrees  Baseline: Goal status: achieved 9/2   2.  Patient  will demonstrate full elbow extension on the left without pain  Baseline:  Goal status: minor tension in the elbow  9/2   3.  Patient will wean out of the sling  Baseline:  Goal status: INITIAL  4.  Patient will be independent with base HEP  Baseline:  Goal status: INITIAL   LONG TERM GOALS: Target date: 01/22/2024    Patient will use left UE for all ADL's without pain  Baseline:  Goal status: INITIAL  2.  Patient will return to work without pain in his elbow  Baseline:  Goal status: INITIAL  3.  Patient will use return to the gym without pain.  Baseline:  Goal status: INITIAL    PLAN: PT FREQUENCY: 2x/week  PT DURATION: 8 weeks  PLANNED INTERVENTIONS: 97110-Therapeutic exercises, 97530-  Therapeutic activity, [id number]- Neuromuscular re-education, 97535- Self Care, [id number]- Manual therapy, [id number]- Gait training, [phone]- Aquatic Therapy, 97014- Electrical stimulation (unattended), 97035- Ultrasound, Patient/Family education, Stair training, Taping, Dry Needling, DME instructions, Cryotherapy, and Moist heat   PLAN FOR NEXT SESSION:  Per MD patient had micro fx and clean up. He can progress as tolerated. Continue PROM. Consider self shoulder flexion. Add in wrist exercises. Progress to active motion as tolerated.    Asberry BRAVO Mellany Dinsmore, PTA 02/01/2024, 5:08 PM

## 2024-02-03 ENCOUNTER — Ambulatory Visit (HOSPITAL_BASED_OUTPATIENT_CLINIC_OR_DEPARTMENT_OTHER): Admitting: Physical Therapy

## 2024-02-03 ENCOUNTER — Encounter (HOSPITAL_BASED_OUTPATIENT_CLINIC_OR_DEPARTMENT_OTHER): Payer: Self-pay | Admitting: Physical Therapy

## 2024-02-03 DIAGNOSIS — M25622 Stiffness of left elbow, not elsewhere classified: Secondary | ICD-10-CM

## 2024-02-03 DIAGNOSIS — M25522 Pain in left elbow: Secondary | ICD-10-CM | POA: Diagnosis not present

## 2024-02-03 NOTE — Therapy (Signed)
 OUTPATIENT PHYSICAL THERAPY UPPER EXTREMITY RECERT/DC    Patient Name: AZIAH BROSTROM MRN: [id number] DOB:Jul 27, 1980, 43 y.o., male Today's Date: 02/03/2024   PHYSICAL THERAPY DISCHARGE SUMMARY  Visits from Start of Care: 13  Current functional level related to goals / functional outcomes: See below    Remaining deficits: See below    Education / Equipment: See below    Patient agrees to discharge. Patient goals were partially met. Patient is being discharged due to being pleased with the current functional level.   END OF SESSION:  PT End of Session - 02/03/24 0912     Visit Number 13    Number of Visits 13    PT Start Time 0858   late arrival   PT Stop Time 0927    PT Time Calculation (min) 29 min    Activity Tolerance Patient tolerated treatment well    Behavior During Therapy [facility] for tasks assessed/performed                 Past Medical History:  Diagnosis Date   GERD (gastroesophageal reflux disease)    Past Surgical History:  Procedure Laterality Date   ELBOW ARTHROSCOPY Left 11/24/2023   Procedure: ARTHROSCOPY, ELBOW;  Surgeon: Genelle Standing, MD;  Location: [facility];  Service: Orthopedics;  Laterality: Left;  LEFT ELBOW ARTHROSCOPY WITH DEBRIDEMENT WITH MICROFRACTURE   WISDOM TOOTH EXTRACTION     Patient Active Problem List   Diagnosis Date Noted   Articular cartilage disorder of elbow, left 11/24/2023   Pain of upper extremity 10/04/2023   Dehydration 03/22/2023   Snoring 03/22/2023   Lip swelling 12/07/2022   Palpitations 12/23/2021   Strain of lumbar region 03/06/2021   Neck pain 09/22/2018   Foot pain 09/15/2017   Advance care planning 09/15/2014   Tinea versicolor 09/15/2014   Back pain 09/15/2014   Routine general medical examination at a health care facility 12/31/2011   GERD (gastroesophageal reflux disease) 03/26/2011    PCP: Dr Arlyss Solian MD  REFERRING PROVIDER: Dr Standing Genelle MD   REFERRING DIAG:  Left Elbow Debridement   THERAPY DIAG:  Pain in left elbow  Stiffness of left elbow, not elsewhere classified  Rationale for Evaluation and Treatment: Rehabilitation  ONSET DATE: 11/24/2023  SUBJECTIVE:                                                                                                                                                                                      SUBJECTIVE STATEMENT:  Progress is a little slower than I thought it would be, but I'm noticing a lot of improvement. Strength is my biggest concern right now. Need  to remember building mm and strength takes time. Had some swelling after PT about 3 sessions ago, its been fine recently.   Eval: He reports  he was performing chin ups when he felt a pop in the back of his arm above the elbow. He has since been unable to fully extend the elbow and has had significant swelling in the area which has started to improve. He was found to have a cartilage defect in his elbow. On 11/24/2023 he had a elbow scope and clean up.  Hand dominance: Right  PERTINENT HISTORY: Nothing pertinent   PAIN:  Are you having pain? No: NPRS scale: 0/10  Pain location: lateral elbow and inside the elbow  Pain description: aching  Aggravating factors: hitting the elbow  Relieving factors: comes and goes   PRECAUTIONS: None  RED FLAGS: None   WEIGHT BEARING RESTRICTIONS: Yes WBAT   FALLS:  Has patient fallen in last 6 months? No  LIVING ENVIRONMENT:  OCCUPATION: Works in [employer] Hobbies:  Likes to work out    PLOF: Independent  PATIENT GOALS:  To have use of his elbow  NEXT MD VISIT:  Next week.  OBJECTIVE:  Note: Objective measures were completed at Evaluation unless otherwise noted.  DIAGNOSTIC FINDINGS:  Nothing post op   PATIENT SURVEYS :  UEFS  Extreme difficulty/unable (0), Quite a bit of difficulty (1), Moderate difficulty (2), Little difficulty (3), No difficulty (4) Survey date:  Eval   02/03/24  Any of your usual work, household or school activities  3  2. Your usual hobbies, recreational/sport activities  1   3. Lifting a bag of groceries to waist level  4   4. Lifting a bag of groceries above your head  4  5. Grooming your hair  4  6. Pushing up on your hands (I.e. from bathtub or chair)  4  7. Preparing food (I.e. peeling/cutting)  4  8. Driving   4  9. Vacuuming, sweeping, or raking  3  10. Dressing   4  11. Doing up buttons  4  12. Using tools/appliances  4  13. Opening doors  3  14. Cleaning   4  15. Tying or lacing shoes  4  16. Sleeping   3  17. Laundering clothes (I.e. washing, ironing, folding)  4  18. Opening a jar  3  19. Throwing a ball  4  20. Carrying a small suitcase with your affected limb.   3  Score total:  12/80 70/80     COGNITION: Overall cognitive status: Within functional limits for tasks assessed     SENSATION: ]block just wore off yesterday otherwise WNL   POSTURE:   UPPER EXTREMITY ROM:   MMT Right eval Left eval Right 02/03/24 Left 02/03/24  Shoulder flexion      Shoulder extension      Shoulder abduction      Shoulder adduction      Shoulder internal rotation      Shoulder external rotation      Elbow flexion   5 5  Elbow extension   5 4  Wrist flexion      Wrist extension      Wrist ulnar deviation      Wrist radial deviation      Wrist pronation      Wrist supination      (Blank rows = not tested) Limited pronation/supination   UPPER EXTREMITY MMT:  PROM  Right eval Left eval Left 02/03/24  AROM  Shoulder flexion     Shoulder extension     Shoulder abduction     Shoulder adduction     Shoulder internal rotation     Shoulder external rotation     Middle trapezius     Lower trapezius     Elbow flexion  100 130*  Elbow extension  -20 from straight  3*  Wrist flexion     Wrist extension     Wrist ulnar deviation     Wrist radial deviation     Wrist pronation  8   Wrist supination  7   Grip  strength (lbs)     (Blank rows = not tested)     PALPATION:  No unexpected TTP                                                                                                                              TREATMENT DATE:    02/03/24  UEFS, ROM, MMT, goals, discussed DC today and remaining objective findings, exercise progression recommendations moving forward after DC   UBE L5x3 min forward/3 min backward 13kg KB chamber + OH press x12 Tricep kick backs 10# x12  13kg KB OH carry half lap/suitcase carry 1/2 lap Tricep skull crushers 1# x12  Bench flies 15# x12      PATIENT EDUCATION: Education details: HEP, symptom management, progression of activity  Person educated: Patient Education method: Explanation, Demonstration, Tactile cues, Verbal cues, and Handouts Education comprehension: verbalized understanding, returned demonstration, verbal cues required, tactile cues required, and needs further education  HOME EXERCISE PROGRAM: Access Code: [email or web address] URL: [email or web address] Date: 11/27/2023 Prepared by: Alm Don   ASSESSMENT:  CLINICAL IMPRESSION:  Arrives today doing well, he reports feeling confident regarding his elbow/recovery/HEP at this point- still noticing some tricep weakness (noted objectively as well per table), also has not been able to return to pushups just yet. We worked on this today. He feels fully confident with DC to independent gym program at this point, hoping to be released to full duty by MD at that visit tomorrow. I think given his active background and lack of sx with functional gym activities, DC is reasonable at this point. Updated cert to account for visits outside of original POC. Thank you for the opportunity to participate in his care!    Eval: Patient is a 43 year old male status post left elbow cartilage debridement and microfracture procedure performed on 11/24/2023.  At this time he presents with expected  limitations in range of motion, strength, and general functional use of left upper extremity.  He would benefit from skilled therapy to return to work and return to active lifestyle.  OBJECTIVE IMPAIRMENTS: decreased activity tolerance, decreased ROM, decreased strength, impaired UE functional use, and pain.   ACTIVITY LIMITATIONS: carrying, lifting, bathing, dressing, reach over head, and hygiene/grooming  PARTICIPATION LIMITATIONS: meal prep, cleaning, laundry, driving, shopping, community activity, occupation, and yard work  PERSONAL FACTORS: None  REHAB POTENTIAL: Excellent  CLINICAL DECISION MAKING: Stable/uncomplicated  EVALUATION COMPLEXITY: Low  GOALS: Goals reviewed with patient? Yes  SHORT TERM GOALS: Target date: 01/23/24    Patient will increase left elbow flexion to 140 degrees  Baseline: Goal status: achieved 9/2   2.  Patient will demonstrate full elbow extension on the left without pain  Baseline:  Goal status: PARTIALLY MET 02/03/24   3.  Patient will wean out of the sling  Baseline:  Goal status: MET 02/03/24  4.  Patient will be independent with base HEP  Baseline:  Goal status: MET 02/03/24   LONG TERM GOALS: Target date: 02/06/24    Patient will use left UE for all ADL's without pain  Baseline:  Goal status: MET 02/03/24  2.  Patient will return to work without pain in his elbow  Baseline:  Goal status: NOT MET 02/03/24 still on light duty, hasn't returned to full work duties yet   3.  Patient will use return to the gym without pain.  Baseline:  Goal status: MET 02/03/24     PLAN: PT FREQUENCY: 2 visits   PT DURATION: 2 weeks  PLANNED INTERVENTIONS: 97110-Therapeutic exercises, 97530- Therapeutic activity, 97112- Neuromuscular re-education, 97535- Self Care, [id number]- Manual therapy, [id number]- Gait training, [phone]- Aquatic Therapy, 97014- Electrical stimulation (unattended), [phone]- Ultrasound, Patient/Family education, Stair training, Taping,  Dry Needling, DME instructions, Cryotherapy, and Moist heat   PLAN FOR NEXT SESSION:    DC today per his request   Josette Rough, PT, DPT 02/03/24 9:32 AM

## 2024-02-04 ENCOUNTER — Ambulatory Visit (INDEPENDENT_AMBULATORY_CARE_PROVIDER_SITE_OTHER): Admitting: Orthopaedic Surgery

## 2024-02-04 DIAGNOSIS — M24122 Other articular cartilage disorders, left elbow: Secondary | ICD-10-CM

## 2024-02-04 NOTE — Progress Notes (Signed)
 Post Operative Evaluation    Procedure/Date of Surgery: Left elbow microfracture with debridement 7/29  Interval History:   Presents today status post the above procedure.  At this time he is doing extremely well.  Range of motion is normalized and he no longer has a catch or any type of pain   PMH/PSH/Family History/Social History/Meds/Allergies:    Past Medical History:  Diagnosis Date   GERD (gastroesophageal reflux disease)    Past Surgical History:  Procedure Laterality Date   ELBOW ARTHROSCOPY Left 11/24/2023   Procedure: ARTHROSCOPY, ELBOW;  Surgeon: Genelle Standing, MD;  Location: [facility];  Service: Orthopedics;  Laterality: Left;  LEFT ELBOW ARTHROSCOPY WITH DEBRIDEMENT WITH MICROFRACTURE   WISDOM TOOTH EXTRACTION     Social History   Socioeconomic History   Marital status: Married    Spouse name: Not on file   Number of children: Not on file   Years of education: Not on file   Highest education level: Not on file  Occupational History   Occupation: Civil engineer, contracting: [employer]  Tobacco Use   Smoking status: Never    Passive exposure: Past   Smokeless tobacco: Never  Vaping Use   Vaping status: Never Used  Substance and Sexual Activity   Alcohol use: No    Alcohol/week: 0.0 standard drinks of alcohol   Drug use: No   Sexual activity: Not on file  Other Topics Concern   Not on file  Social History Narrative   [employer]   [employer] fan   Married 2004   4 daughters   Likes to Hydrologist   Social Drivers of Corporate investment banker Strain: Not on file  Food Insecurity: Not on file  Transportation Needs: Not on file  Physical Activity: Not on file  Stress: Not on file  Social Connections: Not on file   Family History  Problem Relation Age of Onset   Stroke Father    Diabetes Father    Diabetes Other    Colon cancer Neg Hx    Prostate cancer Neg Hx    Allergies  Allergen  Reactions   Milk-Related Compounds Other (See Comments)    GI upset   Current Outpatient Medications  Medication Sig Dispense Refill   aspirin  EC 325 MG tablet Take 1 tablet (325 mg total) by mouth daily. 14 tablet 0   EPINEPHrine  0.3 mg/0.3 mL IJ SOAJ injection Inject 0.3 mg into the muscle as needed for anaphylaxis. 2 each 1   ketoconazole  (NIZORAL ) 2 % shampoo Apply 1 Application topically as directed. Wash trunk daily until clear then wash weekly for prevention. Let sit a few minutes then rinse. 120 mL 6   loratadine  (CLARITIN ) 10 MG tablet Take 1 tablet (10 mg total) by mouth daily.     meloxicam  (MOBIC ) 15 MG tablet Take 0.5-1 tablets (7.5-15 mg total) by mouth daily. Take with food.  Don't take with ibuprofen  or aleve . 30 tablet 1   methocarbamol  (ROBAXIN ) 500 MG tablet Take 1 tablet (500 mg total) by mouth 4 (four) times daily. 30 tablet 3   oxyCODONE  (ROXICODONE ) 5 MG immediate release tablet Take 1 tablet (5 mg total) by mouth every 4 (four) hours as needed for severe pain (pain score 7-10) or breakthrough pain. 10 tablet  0   No current facility-administered medications for this visit.   No results found.  Review of Systems:   A ROS was performed including pertinent positives and negatives as documented in the HPI.   Musculoskeletal Exam:    There were no vitals taken for this visit.  Left elbow incisions are well-appearing without erythema or drainage.  There is some weakness with triceps extension.  Otherwise distal neurosensory exam is intact  Imaging:      I personally reviewed and interpreted the radiographs.   Assessment:   10 weeks status post left elbow microfracture and debridement doing well.  At this time we will plan to see him back as needed.  He will return to full duty at this time Plan :    - Return to clinic as needed     I personally saw and evaluated the patient, and participated in the management and treatment plan.  Elspeth Parker,  MD Attending Physician, Orthopedic Surgery  This document was dictated using Dragon voice recognition software. A reasonable attempt at proof reading has been made to minimize errors.

## 2024-02-10 ENCOUNTER — Encounter (HOSPITAL_BASED_OUTPATIENT_CLINIC_OR_DEPARTMENT_OTHER): Admitting: Physical Therapy

## 2024-02-12 ENCOUNTER — Encounter (HOSPITAL_BASED_OUTPATIENT_CLINIC_OR_DEPARTMENT_OTHER): Admitting: Physical Therapy

## 2024-02-16 ENCOUNTER — Telehealth: Payer: Self-pay

## 2024-02-16 DIAGNOSIS — Z8639 Personal history of other endocrine, nutritional and metabolic disease: Secondary | ICD-10-CM

## 2024-02-16 NOTE — Telephone Encounter (Signed)
 Copied from CRM [id number]. Topic: Clinical - Request for Lab/Test Order >> Feb 16, 2024 12:00 PM Tanner Diaz wrote: Reason for CRM: patient would like to order labs for prior to physical on 11/17

## 2024-02-17 ENCOUNTER — Ambulatory Visit (HOSPITAL_BASED_OUTPATIENT_CLINIC_OR_DEPARTMENT_OTHER): Admitting: Physical Therapy

## 2024-02-17 NOTE — Addendum Note (Signed)
 Addended by: CLEATUS ARLYSS RAMAN on: 02/17/2024 04:31 PM   Modules accepted: Orders

## 2024-02-17 NOTE — Telephone Encounter (Signed)
 I put in the orders.  Please schedule lab visit.  Thanks.

## 2024-02-18 NOTE — Telephone Encounter (Signed)
Schedule labs

## 2024-02-18 NOTE — Telephone Encounter (Signed)
 Attempted to contact pt. No answer, vm box full.   Needs to schedule fasting lab visit 1 wk before 03/15/23 CPE.

## 2024-02-19 ENCOUNTER — Ambulatory Visit (HOSPITAL_BASED_OUTPATIENT_CLINIC_OR_DEPARTMENT_OTHER): Admitting: Physical Therapy

## 2024-02-24 ENCOUNTER — Encounter (HOSPITAL_BASED_OUTPATIENT_CLINIC_OR_DEPARTMENT_OTHER): Admitting: Physical Therapy

## 2024-02-26 ENCOUNTER — Encounter (HOSPITAL_BASED_OUTPATIENT_CLINIC_OR_DEPARTMENT_OTHER): Admitting: Physical Therapy

## 2024-02-29 ENCOUNTER — Encounter: Payer: Self-pay | Admitting: Radiology

## 2024-03-07 ENCOUNTER — Other Ambulatory Visit

## 2024-03-07 DIAGNOSIS — Z8639 Personal history of other endocrine, nutritional and metabolic disease: Secondary | ICD-10-CM | POA: Diagnosis not present

## 2024-03-07 LAB — COMPREHENSIVE METABOLIC PANEL WITH GFR
ALT: 23 U/L (ref 0–53)
AST: 23 U/L (ref 0–37)
Albumin: 4.6 g/dL (ref 3.5–5.2)
Alkaline Phosphatase: 29 U/L — ABNORMAL LOW (ref 39–117)
BUN: 12 mg/dL (ref 6–23)
CO2: 29 meq/L (ref 19–32)
Calcium: 9.4 mg/dL (ref 8.4–10.5)
Chloride: 102 meq/L (ref 96–112)
Creatinine, Ser: 1.34 mg/dL (ref 0.40–1.50)
GFR: 63.84 mL/min (ref >60.00)
Glucose, Bld: 84 mg/dL (ref 70–99)
Potassium: 3.9 meq/L (ref 3.5–5.1)
Sodium: 139 meq/L (ref 135–145)
Total Bilirubin: 0.6 mg/dL (ref 0.2–1.2)
Total Protein: 7.3 g/dL (ref 6.0–8.3)

## 2024-03-07 LAB — LIPID PANEL
Cholesterol: 172 mg/dL (ref 0–200)
HDL: 46.6 mg/dL (ref >39.00)
LDL Cholesterol: 104 mg/dL — ABNORMAL HIGH (ref 0–99)
NonHDL: 124.92
Total CHOL/HDL Ratio: 4
Triglycerides: 103 mg/dL (ref 0.0–149.0)
VLDL: 20.6 mg/dL (ref 0.0–40.0)

## 2024-03-09 ENCOUNTER — Ambulatory Visit: Payer: Self-pay | Admitting: Family Medicine

## 2024-03-14 ENCOUNTER — Ambulatory Visit (INDEPENDENT_AMBULATORY_CARE_PROVIDER_SITE_OTHER): Admitting: Family Medicine

## 2024-03-14 ENCOUNTER — Encounter: Payer: Self-pay | Admitting: Family Medicine

## 2024-03-14 VITALS — BP 126/78 | HR 74 | Temp 98.2°F | Ht 68.25 in | Wt 244.5 lb

## 2024-03-14 DIAGNOSIS — K219 Gastro-esophageal reflux disease without esophagitis: Secondary | ICD-10-CM

## 2024-03-14 DIAGNOSIS — R220 Localized swelling, mass and lump, head: Secondary | ICD-10-CM

## 2024-03-14 DIAGNOSIS — Z7189 Other specified counseling: Secondary | ICD-10-CM

## 2024-03-14 DIAGNOSIS — Z0000 Encounter for general adult medical examination without abnormal findings: Secondary | ICD-10-CM

## 2024-03-14 DIAGNOSIS — Z1211 Encounter for screening for malignant neoplasm of colon: Secondary | ICD-10-CM

## 2024-03-14 DIAGNOSIS — Z7184 Encounter for health counseling related to travel: Secondary | ICD-10-CM

## 2024-03-14 MED ORDER — OMEPRAZOLE 20 MG PO CPDR: 20.0000 mg | DELAYED_RELEASE_CAPSULE | Freq: Every day | ORAL | Status: DC

## 2024-03-14 MED ORDER — SCOPOLAMINE 1 MG/3DAYS TD PT72: 1.0000 | MEDICATED_PATCH | TRANSDERMAL | 0 refills | Status: AC

## 2024-03-14 MED ORDER — EPINEPHRINE 0.3 MG/0.3ML IJ SOAJ: 0.3000 mg | INTRAMUSCULAR | 1 refills | Status: AC | PRN

## 2024-03-14 MED ORDER — HYDROXYZINE HCL 10 MG PO TABS: 10.0000 mg | ORAL_TABLET | Freq: Three times a day (TID) | ORAL | 0 refills | Status: DC | PRN

## 2024-03-14 NOTE — Progress Notes (Unsigned)
 CPE- See plan.  Routine anticipatory guidance given to patient.  See health maintenance.  The possibility exists that previously documented standard health maintenance information may have been brought forward from a previous encounter into this note.  If needed, that same information has been updated to reflect the current situation based on today's encounter.    Tetanus 2021- done out of clinic.  Flu d/w pt. encouraged. PNA and shingles not due  Covid vaccine prev done.   Prostate cancer screening and PSA options (with potential risks and benefits of testing vs not testing) were discussed along with recent recs/guidelines.  PSA not indicated.  D/w patient mz:neupnwd for colon cancer screening, including IFOB vs. colonoscopy.  Risks and benefits of both were discussed and patient voiced understanding.  Pt elects for: Living will d/w pt.  Wife designated if patient were incapacitated.  Diet and exercise d/w pt.   HIV screening done prev about 2010.   He is going on a cruise, d/w pt about scopolamine patch with routine cautions.  D/w pt about hydroxyzine use with routine cautions, re: travel anxiety.  Advised not to use together.   D/w pt about prev episodes with lip swelling and sent rx for epi pen.    Mother was recently dx'd with cancer.    Elbow pain is better and he is working on strengthening, back to 85% strength per patient report compared to the R.    He has lactose intolerance.  He had improved with probiotics but over the last 6 months he had frequent BMs, bloating, indigestion, abd discomfort.  He is still limiting dairy.  Had needed to take TUMS and pepcid AC.  Sx noted about 1 hour after eating.  No bloody black or greasy stools.  Sig reflux with throat irritation/acidic sensation in the throat, noted with burping.  Trigger foods- spicy foods, dairy.    PMH and SH reviewed  Meds, vitals, and allergies reviewed.   ROS: Per HPI.  Unless specifically indicated otherwise in HPI,  the patient denies:  General: fever. Eyes: acute vision changes ENT: sore throat Cardiovascular: chest pain Respiratory: SOB GI: vomiting GU: dysuria Musculoskeletal: acute back pain Derm: acute rash Neuro: acute motor dysfunction Psych: worsening mood Endocrine: polydipsia Heme: bleeding Allergy: hayfever  GEN: nad, alert and oriented HEENT: mucous membranes moist NECK: supple w/o LA CV: rrr. PULM: ctab, no inc wob ABD: soft, +bs EXT: no edema SKIN: no acute rash

## 2024-03-14 NOTE — Patient Instructions (Addendum)
 Refer to GI.  See if you notice any triggers on the FODMAP diet.  You could try prilosec OTC for about 2 weeks.   Use the patch if needed on the trip.  Don't use the hydroxyzine while using the patch. Sedation caution on both.   Take care.  Glad to see you.

## 2024-03-16 DIAGNOSIS — Z7184 Encounter for health counseling related to travel: Secondary | ICD-10-CM | POA: Insufficient documentation

## 2024-03-16 NOTE — Assessment & Plan Note (Signed)
 Refer to GI.  I asked him to see if he notices any triggers on the FODMAP diet.  Handout given. He could try prilosec OTC for about 2 weeks.

## 2024-03-16 NOTE — Assessment & Plan Note (Signed)
 Living will d/w pt.  Wife designated if patient were incapacitated.   ?

## 2024-03-16 NOTE — Assessment & Plan Note (Signed)
 D/w pt about prev episodes with lip swelling and sent rx for epi pen.  No symptoms recently or currently.

## 2024-03-16 NOTE — Assessment & Plan Note (Signed)
 Tetanus 2021- done out of clinic.  Flu d/w pt. encouraged. PNA and shingles not due  Covid vaccine prev done.   Prostate cancer screening and PSA options (with potential risks and benefits of testing vs not testing) were discussed along with recent recs/guidelines.  PSA not indicated.  D/w patient mz:neupnwd for colon cancer screening, including IFOB vs. colonoscopy.  Risks and benefits of both were discussed and patient voiced understanding.  Pt elects for: Colonoscopy. Living will d/w pt.  Wife designated if patient were incapacitated.  Diet and exercise d/w pt.   HIV screening done prev about 2010.

## 2024-03-16 NOTE — Assessment & Plan Note (Signed)
 He is going on a cruise, d/w pt about scopolamine patch with routine cautions.  D/w pt about hydroxyzine use with routine cautions, re: travel anxiety.  Advised not to use together.

## 2024-03-29 ENCOUNTER — Ambulatory Visit: Payer: Self-pay | Admitting: Family Medicine

## 2024-03-29 MED ORDER — OSELTAMIVIR PHOSPHATE 75 MG PO CAPS: 75.0000 mg | ORAL_CAPSULE | Freq: Two times a day (BID) | ORAL | 0 refills | Status: DC

## 2024-03-29 NOTE — Addendum Note (Signed)
 Addended by: CLEATUS ARLYSS RAMAN on: 03/29/2024 02:04 PM   Modules accepted: Orders

## 2024-03-29 NOTE — Telephone Encounter (Signed)
  FYI Only or Action Required?: Action required by provider: requesting tamiflu .  Patient was last seen in primary care on 03/14/2024 by Cleatus Arlyss RAMAN, MD.  Called Nurse Triage reporting Sinusitis.  Symptoms began several days ago.  Interventions attempted: OTC medications: halls, dayquil.  Symptoms are: stable.  Triage Disposition: Home Care  Patient/caregiver understands and will follow disposition?: Yes  Reason for Disposition  [1] Sinus congestion as part of a cold AND [2] present < 10 days  Answer Assessment - Initial Assessment Questions 2 kids tested positive for Flu yesterday at urgent [facility]. Halls and dayquil, tylenol . Denies SOB, dizziness. Patient is requesting Tamiflu  be sent to [facility] on file. Please advise. Patient requesting call back. [phone]  1. LOCATION: Where does it hurt?      Behind eyes and bridge of nose  2. ONSET: When did the sinus pain start?  (e.g., hours, days)      Sunday  3. NASAL CONGESTION: Is the nose blocked? If Yes, ask: Can you open it or must you breathe through your mouth?     Partially blocked,  mouth breathing  4. NASAL DISCHARGE: Do you have discharge from your nose? If so ask, What color?     Greenish, yellow  5. FEVER: Do you have a fever? If Yes, ask: What is it, how was it measured, and when did it start?      Denies  6. OTHER SYMPTOMS: Do you have any other symptoms? (e.g., sore throat, cough, earache, difficulty breathing)     Sore throat, headache  Protocols used: Sinus Pain or Congestion-A-AH

## 2024-03-29 NOTE — Telephone Encounter (Signed)
 Informed patient Rx was sent to Pharmacy.

## 2024-03-29 NOTE — Telephone Encounter (Signed)
 Rx sent. Thanks

## 2024-04-27 ENCOUNTER — Telehealth: Admitting: General Practice

## 2024-04-27 VITALS — Ht 68.25 in | Wt 238.0 lb

## 2024-04-27 DIAGNOSIS — R051 Acute cough: Secondary | ICD-10-CM | POA: Diagnosis not present

## 2024-04-27 MED ORDER — BENZONATATE 200 MG PO CAPS: 200.0000 mg | ORAL_CAPSULE | Freq: Three times a day (TID) | ORAL | 0 refills | Status: AC | PRN

## 2024-04-27 MED ORDER — PROMETHAZINE-DM 6.25-15 MG/5ML PO SYRP: 5.0000 mL | ORAL_SOLUTION | Freq: Four times a day (QID) | ORAL | 0 refills | Status: AC | PRN

## 2024-04-27 NOTE — Patient Instructions (Addendum)
 Start Benzonatate  capsules for cough. Take 1 capsule by mouth three times daily as needed for cough.  Start Promethazine -DM cough syrup at night time for cough. As discussed it can make you sleepy.   You can try a few things over the counter to help with your symptoms including:  Cough: Delsym or Robitussin (get the off brand, works just as well) Chest Congestion: Mucinex  (plain) Nasal Congestion/Ear Pressure/Sinus Pressure: Try using Flonase (fluticasone) nasal spray. Instill 1 spray in each nostril twice daily. This can be purchased over the counter. Body aches, fevers, headache: Ibuprofen  (not to exceed 2400 mg in 24 hours) or Acetaminophen -Tylenol  (not to exceed 3000 mg in 24 hours) Runny Nose/Throat Drainage/Sneezing/Itchy or Watery Eyes: An antihistamine such as Zyrtec, Claritin , Xyzal, Allegra  You should be feeling better by day seven of symptoms, but please do schedule an appointment if this is not the case.  Increase fluid intake, rest and use a cool-mist humidifier.   Follow up in the office not better.   It was a pleasure to see you today!

## 2024-04-27 NOTE — Progress Notes (Signed)
 "  Virtual Visit via Video Note  I connected with Tanner Diaz on 04/29/2024 at  9:00 AM EST by a video enabled telemedicine application and verified that I am speaking with the correct person using two identifiers.  Patient Location: Home Provider Location: Office/Clinic  I discussed the limitations, risks, security, and privacy concerns of performing an evaluation and management service by video and the availability of in person appointments. I also discussed with the patient that there may be a patient responsible charge related to this service. The patient expressed understanding and agreed to proceed.  Subjective: PCP: Cleatus Arlyss RAMAN, MD  Chief Complaint  Patient presents with   Cough    With post nasal drip; sometimes cough is productive. Patient has had sx since about Sunday. Patient has been taking robitussin for his sx. No at home testing has been done.    Cough   Discussed the use of AI scribe software for clinical note transcription with the patient, who gave verbal consent to proceed.  History of Present Illness Tanner Diaz is a 43 year old male who presents with a cough and postnasal drip.  He has experienced a cough since Sunday, which is productive of dark yellow sputum. He reports a sensation of drainage in the back of his throat. No nasal congestion, sinus pain, sore throat, or sinus pressure.  He experiences occasional wheezing, particularly when taking a deep breath, which sometimes triggers a cough that helps clear the wheeze. No chest pain or shortness of breath.  He is a non-smoker and has not received recent vaccinations for flu or pneumonia. His wife had a dry cough last week, and his children had the flu the week after Thanksgiving, but he did not exhibit symptoms at that time.  He has not been diagnosed with asthma, though he suspects he may have had it as a child without formal diagnosis. He has not performed a COVID or flu test at home.     ROS: Per HPI Current Medications[1]  Observations/Objective: Today's Vitals   04/27/24 0852  Weight: 238 lb (108 kg)  Height: 5' 8.25 (1.734 m)   Physical Exam Nursing note reviewed.  Constitutional:      Appearance: Normal appearance.  Eyes:     Conjunctiva/sclera: Conjunctivae normal.  Pulmonary:     Effort: Pulmonary effort is normal.  Neurological:     Mental Status: He is alert and oriented to person, place, and time.  Psychiatric:        Mood and Affect: Mood normal.        Behavior: Behavior normal.        Thought Content: Thought content normal.        Judgment: Judgment normal.     Assessment and Plan: Acute cough -     Promethazine -DM; Take 5 mLs by mouth 4 (four) times daily as needed.  Dispense: 118 mL; Refill: 0 -     Benzonatate ; Take 1 capsule (200 mg total) by mouth 3 (three) times daily as needed for cough.  Dispense: 20 capsule; Refill: 0   Assessment and Plan Assessment & Plan Post-viral cough Likely post-viral cough following recent flu-like illness. Low suspicion for bronchitis, sinus infection, or pneumonia. No antibiotics needed. - Recommended Mucinex  for chest congestion, twice daily, non-DM formulation. - Recommended Claritin  or Zyrtec for postnasal drip. - Prescribed stronger cough syrup for nighttime use. - Prescribed Tessalon  Perles for daytime use, up to three times daily. - Advised increased fluid intake, rest,  and use of a cool mist humidifier. - Instructed to monitor symptoms and contact office if symptoms worsen or persist beyond the weekend. - Advised to seek emergency care if experiencing chest pain, shortness of breath, or oxygen level drop.   Follow Up Instructions: Return if symptoms worsen or fail to improve.   I discussed the assessment and treatment plan with the patient. The patient was provided an opportunity to ask questions, and all were answered. The patient agreed with the plan and demonstrated an understanding of the  instructions.   The patient was advised to call back or seek an in-person evaluation if the symptoms worsen or if the condition fails to improve as anticipated.  The above assessment and management plan was discussed with the patient. The patient verbalized understanding of and has agreed to the management plan.   Carrol Aurora, NP     [1]  Current Outpatient Medications:    benzonatate  (TESSALON ) 200 MG capsule, Take 1 capsule (200 mg total) by mouth 3 (three) times daily as needed for cough., Disp: 20 capsule, Rfl: 0   EPINEPHrine  0.3 mg/0.3 mL IJ SOAJ injection, Inject 0.3 mg into the muscle as needed for anaphylaxis., Disp: 2 each, Rfl: 1   loratadine  (CLARITIN ) 10 MG tablet, Take 1 tablet (10 mg total) by mouth daily., Disp: , Rfl:    promethazine -dextromethorphan (PROMETHAZINE -DM) 6.25-15 MG/5ML syrup, Take 5 mLs by mouth 4 (four) times daily as needed., Disp: 118 mL, Rfl: 0   scopolamine  (TRANSDERM-SCOP) 1 MG/3DAYS, Place 1 patch (1 mg total) onto the skin every 3 (three) days., Disp: 4 patch, Rfl: 0  "
